# Patient Record
Sex: Female | Born: 1950 | Race: White | Hispanic: No | Smoking: Never smoker
Health system: Southern US, Community
[De-identification: ages and names within clinical notes are randomized; demographics above are authoritative.]

## PROBLEM LIST (undated history)

## (undated) DIAGNOSIS — M150 Primary generalized (osteo)arthritis: Secondary | ICD-10-CM

## (undated) DIAGNOSIS — K649 Unspecified hemorrhoids: Secondary | ICD-10-CM

## (undated) DIAGNOSIS — I10 Essential (primary) hypertension: Secondary | ICD-10-CM

## (undated) DIAGNOSIS — Z9889 Other specified postprocedural states: Secondary | ICD-10-CM

## (undated) DIAGNOSIS — K5790 Diverticulosis of intestine, part unspecified, without perforation or abscess without bleeding: Secondary | ICD-10-CM

## (undated) DIAGNOSIS — F32A Depression, unspecified: Secondary | ICD-10-CM

## (undated) DIAGNOSIS — T8859XA Other complications of anesthesia, initial encounter: Secondary | ICD-10-CM

## (undated) DIAGNOSIS — E039 Hypothyroidism, unspecified: Secondary | ICD-10-CM

## (undated) DIAGNOSIS — M797 Fibromyalgia: Secondary | ICD-10-CM

## (undated) DIAGNOSIS — M5416 Radiculopathy, lumbar region: Secondary | ICD-10-CM

## (undated) DIAGNOSIS — E079 Disorder of thyroid, unspecified: Secondary | ICD-10-CM

## (undated) DIAGNOSIS — K219 Gastro-esophageal reflux disease without esophagitis: Secondary | ICD-10-CM

## (undated) DIAGNOSIS — R0600 Dyspnea, unspecified: Secondary | ICD-10-CM

## (undated) DIAGNOSIS — M778 Other enthesopathies, not elsewhere classified: Secondary | ICD-10-CM

## (undated) DIAGNOSIS — K760 Fatty (change of) liver, not elsewhere classified: Secondary | ICD-10-CM

## (undated) DIAGNOSIS — M5412 Radiculopathy, cervical region: Secondary | ICD-10-CM

## (undated) DIAGNOSIS — E785 Hyperlipidemia, unspecified: Secondary | ICD-10-CM

## (undated) DIAGNOSIS — D122 Benign neoplasm of ascending colon: Secondary | ICD-10-CM

## (undated) DIAGNOSIS — G4733 Obstructive sleep apnea (adult) (pediatric): Secondary | ICD-10-CM

## (undated) DIAGNOSIS — G43909 Migraine, unspecified, not intractable, without status migrainosus: Secondary | ICD-10-CM

## (undated) DIAGNOSIS — R911 Solitary pulmonary nodule: Secondary | ICD-10-CM

## (undated) DIAGNOSIS — R768 Other specified abnormal immunological findings in serum: Secondary | ICD-10-CM

## (undated) DIAGNOSIS — R079 Chest pain, unspecified: Secondary | ICD-10-CM

## (undated) DIAGNOSIS — M8580 Other specified disorders of bone density and structure, unspecified site: Secondary | ICD-10-CM

## (undated) DIAGNOSIS — J449 Chronic obstructive pulmonary disease, unspecified: Secondary | ICD-10-CM

## (undated) HISTORY — PX: EYE SURGERY: SHX253

## (undated) HISTORY — PX: BUNIONECTOMY: SHX129

## (undated) HISTORY — PX: BACK SURGERY: SHX140

## (undated) HISTORY — PX: ABDOMINAL HYSTERECTOMY: SHX81

---

## 2010-06-23 ENCOUNTER — Ambulatory Visit: Payer: Self-pay | Admitting: Internal Medicine

## 2011-03-05 ENCOUNTER — Ambulatory Visit: Payer: Self-pay | Admitting: Family Medicine

## 2011-07-26 ENCOUNTER — Emergency Department: Payer: Self-pay | Admitting: Internal Medicine

## 2012-04-11 ENCOUNTER — Ambulatory Visit: Payer: Self-pay | Admitting: Family Medicine

## 2012-06-06 ENCOUNTER — Ambulatory Visit: Payer: Self-pay | Admitting: Family Medicine

## 2012-11-04 ENCOUNTER — Ambulatory Visit: Payer: Self-pay | Admitting: Family Medicine

## 2013-04-27 ENCOUNTER — Ambulatory Visit: Payer: Self-pay | Admitting: Family Medicine

## 2013-06-04 ENCOUNTER — Ambulatory Visit: Payer: Self-pay | Admitting: Family Medicine

## 2013-08-27 ENCOUNTER — Ambulatory Visit: Payer: Self-pay | Admitting: Specialist

## 2013-12-23 ENCOUNTER — Emergency Department: Payer: Self-pay | Admitting: Emergency Medicine

## 2013-12-23 LAB — CBC WITH DIFFERENTIAL/PLATELET
Basophil #: 0 10*3/uL (ref 0.0–0.1)
Basophil %: 0.3 %
Eosinophil #: 0.1 10*3/uL (ref 0.0–0.7)
Lymphocyte #: 0.8 10*3/uL — ABNORMAL LOW (ref 1.0–3.6)
Lymphocyte %: 6.6 %
MCH: 28.3 pg (ref 26.0–34.0)
MCHC: 33.2 g/dL (ref 32.0–36.0)
MCV: 85 fL (ref 80–100)
Monocyte #: 0.8 x10 3/mm (ref 0.2–0.9)
Monocyte %: 7 %
Neutrophil %: 85.4 %
Platelet: 266 10*3/uL (ref 150–440)
RBC: 5.28 10*6/uL — ABNORMAL HIGH (ref 3.80–5.20)
RDW: 13.7 % (ref 11.5–14.5)

## 2013-12-23 LAB — COMPREHENSIVE METABOLIC PANEL
Albumin: 4.6 g/dL (ref 3.4–5.0)
Alkaline Phosphatase: 110 U/L
Anion Gap: 6 — ABNORMAL LOW (ref 7–16)
BUN: 18 mg/dL (ref 7–18)
Bilirubin,Total: 0.4 mg/dL (ref 0.2–1.0)
Creatinine: 1.02 mg/dL (ref 0.60–1.30)
EGFR (African American): >60
EGFR (Non-African Amer.): 58 — ABNORMAL LOW
Osmolality: 274 (ref 275–301)
Potassium: 3.5 mmol/L (ref 3.5–5.1)
SGOT(AST): 38 U/L — ABNORMAL HIGH (ref 15–37)
SGPT (ALT): 75 U/L (ref 12–78)
Sodium: 136 mmol/L (ref 136–145)

## 2013-12-23 LAB — URINALYSIS, COMPLETE
Bacteria: NONE SEEN
Bilirubin,UR: NEGATIVE
Glucose,UR: NEGATIVE mg/dL (ref 0–75)
Nitrite: NEGATIVE
RBC,UR: 2 /HPF (ref 0–5)
Specific Gravity: 1.012 (ref 1.003–1.030)
Squamous Epithelial: 1
WBC UR: 1 /HPF (ref 0–5)

## 2013-12-23 LAB — RAPID INFLUENZA A&B ANTIGENS

## 2014-01-05 ENCOUNTER — Ambulatory Visit: Payer: Self-pay | Admitting: Family Medicine

## 2014-04-26 ENCOUNTER — Ambulatory Visit: Payer: Self-pay | Admitting: Family Medicine

## 2014-05-06 ENCOUNTER — Ambulatory Visit: Payer: Self-pay | Admitting: Family Medicine

## 2014-06-01 ENCOUNTER — Ambulatory Visit: Payer: Self-pay | Admitting: Anesthesiology

## 2014-06-01 LAB — POTASSIUM: Potassium: 3.4 mmol/L — ABNORMAL LOW (ref 3.5–5.1)

## 2014-06-11 ENCOUNTER — Ambulatory Visit: Payer: Self-pay | Admitting: Specialist

## 2015-04-08 ENCOUNTER — Other Ambulatory Visit: Payer: Self-pay | Admitting: Family Medicine

## 2015-04-08 DIAGNOSIS — Z1231 Encounter for screening mammogram for malignant neoplasm of breast: Secondary | ICD-10-CM

## 2015-04-16 NOTE — Op Note (Signed)
PATIENT NAME:  Emily Berry, HARTS MR#:  [id number] DATE OF BIRTH:  [birth year]  DATE OF PROCEDURE:  06/11/2014  PREOPERATIVE DIAGNOSIS:  Greater trochanteric bursitis, right hip.   POSTOPERATIVE DIAGNOSIS:  Greater trochanteric bursitis, right hip.   PROCEDURE PERFORMED:  Arthroscopic release of lateral fascia, right hip.   SURGEON:  Christophe Louis, M.D.   ANESTHESIA:  General.   COMPLICATIONS:  None.   DESCRIPTION OF PROCEDURE:  After adequate induction of general anesthesia, the right hip and leg were thoroughly prepped with alcohol and ChloraPrep and draped in standard sterile fashion. The 2 arthroscopic portals are infiltrated with 1% Xylocaine with epinephrine. One is approximately 3 inches proximal to the tip of the greater trochanter and the other is approximately 4 inches distal to the greater trochanter. Portals are then made with the knife. The trocar is used to clear the fat tissue off of the lateral fascia. Arthroscope is introduced proximally and the fascia lata visualized. The ArthroWand is introduced distally and visualized. The lateral fascia is then released from proximal to the tip of the greater trochanter to approximately 3 cm distally. Hypertrophic bursa material is resected. Range of motion demonstrates good clearance of the greater trochanter from the fascia lata. The wound is thoroughly irrigated multiple times. Portals are closed with 4-0 nylon. A soft bulky dressing is applied. The patient is returned to the recovery room in satisfactory condition having tolerated the procedure quite well.     ____________________________ Lucas Mallow, MD ces:dmm D: 06/17/2014 08:28:45 ET T: 06/17/2014 12:26:47 ET JOB#: [id number]  cc: Lucas Mallow, MD, <Dictator> Lucas Mallow MD ELECTRONICALLY SIGNED 06/18/2014 11:44

## 2015-05-09 ENCOUNTER — Other Ambulatory Visit: Payer: Self-pay | Admitting: Family Medicine

## 2015-05-09 ENCOUNTER — Ambulatory Visit
Admission: RE | Admit: 2015-05-09 | Discharge: 2015-05-09 | Disposition: A | Discharge status: Home / Self Care | Payer: Medicare HMO | Source: Ambulatory Visit | Attending: Family Medicine | Admitting: Family Medicine

## 2015-05-09 DIAGNOSIS — Z1231 Encounter for screening mammogram for malignant neoplasm of breast: Secondary | ICD-10-CM | POA: Diagnosis not present

## 2015-07-25 DIAGNOSIS — D239 Other benign neoplasm of skin, unspecified: Secondary | ICD-10-CM

## 2015-07-25 HISTORY — DX: Other benign neoplasm of skin, unspecified: D23.9

## 2015-11-11 ENCOUNTER — Other Ambulatory Visit: Payer: Self-pay | Admitting: Family Medicine

## 2015-11-11 DIAGNOSIS — M8580 Other specified disorders of bone density and structure, unspecified site: Secondary | ICD-10-CM

## 2015-11-14 ENCOUNTER — Other Ambulatory Visit: Payer: Self-pay | Admitting: Family Medicine

## 2015-11-14 DIAGNOSIS — R748 Abnormal levels of other serum enzymes: Secondary | ICD-10-CM

## 2015-11-18 ENCOUNTER — Ambulatory Visit
Admission: RE | Admit: 2015-11-18 | Discharge: 2015-11-18 | Disposition: A | Discharge status: Home / Self Care | Payer: Medicare HMO | Source: Ambulatory Visit | Attending: Family Medicine | Admitting: Family Medicine

## 2015-11-18 DIAGNOSIS — R748 Abnormal levels of other serum enzymes: Secondary | ICD-10-CM | POA: Diagnosis present

## 2015-11-24 ENCOUNTER — Ambulatory Visit: Payer: Medicare HMO | Attending: Family Medicine

## 2015-12-14 ENCOUNTER — Ambulatory Visit
Admission: RE | Admit: 2015-12-14 | Discharge: 2015-12-14 | Disposition: A | Discharge status: Home / Self Care | Payer: Medicare HMO | Source: Ambulatory Visit | Attending: Family Medicine | Admitting: Family Medicine

## 2015-12-14 DIAGNOSIS — Z780 Asymptomatic menopausal state: Secondary | ICD-10-CM | POA: Insufficient documentation

## 2015-12-14 DIAGNOSIS — M8580 Other specified disorders of bone density and structure, unspecified site: Secondary | ICD-10-CM | POA: Insufficient documentation

## 2016-01-04 ENCOUNTER — Other Ambulatory Visit: Payer: Self-pay | Admitting: Family Medicine

## 2016-01-04 ENCOUNTER — Ambulatory Visit
Admission: RE | Admit: 2016-01-04 | Discharge: 2016-01-04 | Disposition: A | Discharge status: Home / Self Care | Payer: Medicare HMO | Source: Ambulatory Visit | Attending: Family Medicine | Admitting: Family Medicine

## 2016-01-04 DIAGNOSIS — M79606 Pain in leg, unspecified: Secondary | ICD-10-CM | POA: Insufficient documentation

## 2016-01-04 DIAGNOSIS — M79609 Pain in unspecified limb: Secondary | ICD-10-CM

## 2016-06-27 ENCOUNTER — Other Ambulatory Visit: Payer: Self-pay | Admitting: Family Medicine

## 2016-06-27 DIAGNOSIS — Z1231 Encounter for screening mammogram for malignant neoplasm of breast: Secondary | ICD-10-CM

## 2016-07-03 ENCOUNTER — Ambulatory Visit: Payer: Medicare HMO

## 2016-07-12 ENCOUNTER — Other Ambulatory Visit: Payer: Self-pay | Admitting: Family Medicine

## 2016-07-12 ENCOUNTER — Ambulatory Visit
Admission: RE | Admit: 2016-07-12 | Discharge: 2016-07-12 | Disposition: A | Discharge status: Home / Self Care | Payer: Medicare HMO | Source: Ambulatory Visit | Attending: Family Medicine | Admitting: Family Medicine

## 2016-07-12 DIAGNOSIS — Z1231 Encounter for screening mammogram for malignant neoplasm of breast: Secondary | ICD-10-CM | POA: Diagnosis not present

## 2016-07-12 DIAGNOSIS — R928 Other abnormal and inconclusive findings on diagnostic imaging of breast: Secondary | ICD-10-CM | POA: Diagnosis not present

## 2016-07-16 ENCOUNTER — Other Ambulatory Visit: Payer: Self-pay | Admitting: Family Medicine

## 2016-07-16 DIAGNOSIS — N6310 Unspecified lump in the right breast, unspecified quadrant: Secondary | ICD-10-CM

## 2016-07-18 ENCOUNTER — Ambulatory Visit
Admission: RE | Admit: 2016-07-18 | Discharge: 2016-07-18 | Disposition: A | Discharge status: Home / Self Care | Payer: Medicare HMO | Source: Ambulatory Visit | Attending: Family Medicine | Admitting: Family Medicine

## 2016-07-18 DIAGNOSIS — N6001 Solitary cyst of right breast: Secondary | ICD-10-CM | POA: Diagnosis not present

## 2016-07-18 DIAGNOSIS — N6310 Unspecified lump in the right breast, unspecified quadrant: Secondary | ICD-10-CM

## 2016-07-18 DIAGNOSIS — N63 Unspecified lump in breast: Secondary | ICD-10-CM | POA: Diagnosis present

## 2016-12-06 ENCOUNTER — Ambulatory Visit
Admission: RE | Admit: 2016-12-06 | Discharge: 2016-12-06 | Disposition: A | Discharge status: Home / Self Care | Payer: Medicare HMO | Source: Ambulatory Visit | Attending: Family Medicine | Admitting: Family Medicine

## 2016-12-06 ENCOUNTER — Other Ambulatory Visit: Payer: Self-pay | Admitting: Family Medicine

## 2016-12-06 DIAGNOSIS — M79622 Pain in left upper arm: Secondary | ICD-10-CM

## 2016-12-26 ENCOUNTER — Ambulatory Visit
Admission: RE | Admit: 2016-12-26 | Discharge: 2016-12-26 | Disposition: A | Discharge status: Home / Self Care | Payer: Medicare HMO | Source: Ambulatory Visit | Attending: Family Medicine | Admitting: Family Medicine

## 2016-12-26 ENCOUNTER — Other Ambulatory Visit: Payer: Self-pay | Admitting: Family Medicine

## 2016-12-26 DIAGNOSIS — M79622 Pain in left upper arm: Secondary | ICD-10-CM | POA: Diagnosis present

## 2017-02-13 DIAGNOSIS — M79602 Pain in left arm: Secondary | ICD-10-CM | POA: Insufficient documentation

## 2017-02-21 ENCOUNTER — Other Ambulatory Visit: Payer: Self-pay | Admitting: Student

## 2017-02-21 DIAGNOSIS — M7582 Other shoulder lesions, left shoulder: Principal | ICD-10-CM

## 2017-02-21 DIAGNOSIS — M778 Other enthesopathies, not elsewhere classified: Secondary | ICD-10-CM

## 2017-03-06 ENCOUNTER — Ambulatory Visit: Payer: Medicare HMO

## 2017-03-12 ENCOUNTER — Ambulatory Visit
Admission: RE | Admit: 2017-03-12 | Discharge: 2017-03-12 | Disposition: A | Discharge status: Home / Self Care | Payer: Medicare HMO | Source: Ambulatory Visit | Attending: Student | Admitting: Student

## 2017-03-12 DIAGNOSIS — M19012 Primary osteoarthritis, left shoulder: Secondary | ICD-10-CM | POA: Diagnosis not present

## 2017-03-12 DIAGNOSIS — M7582 Other shoulder lesions, left shoulder: Secondary | ICD-10-CM | POA: Insufficient documentation

## 2017-03-12 DIAGNOSIS — M778 Other enthesopathies, not elsewhere classified: Secondary | ICD-10-CM

## 2017-04-16 DIAGNOSIS — M7582 Other shoulder lesions, left shoulder: Secondary | ICD-10-CM

## 2017-04-16 DIAGNOSIS — M778 Other enthesopathies, not elsewhere classified: Secondary | ICD-10-CM | POA: Insufficient documentation

## 2017-05-23 ENCOUNTER — Ambulatory Visit
Admission: RE | Admit: 2017-05-23 | Discharge: 2017-05-23 | Disposition: A | Discharge status: Home / Self Care | Payer: Medicare HMO | Source: Ambulatory Visit | Attending: Family Medicine | Admitting: Family Medicine

## 2017-05-23 ENCOUNTER — Other Ambulatory Visit: Payer: Self-pay | Admitting: Family Medicine

## 2017-05-23 DIAGNOSIS — M25512 Pain in left shoulder: Secondary | ICD-10-CM | POA: Diagnosis not present

## 2017-05-23 DIAGNOSIS — M79622 Pain in left upper arm: Secondary | ICD-10-CM

## 2017-05-29 ENCOUNTER — Emergency Department
Admission: EM | Admit: 2017-05-29 | Discharge: 2017-05-29 | Disposition: A | Discharge status: Home / Self Care | Payer: Medicare HMO | Attending: Emergency Medicine | Admitting: Emergency Medicine

## 2017-05-29 DIAGNOSIS — M19012 Primary osteoarthritis, left shoulder: Secondary | ICD-10-CM | POA: Insufficient documentation

## 2017-05-29 DIAGNOSIS — M779 Enthesopathy, unspecified: Secondary | ICD-10-CM

## 2017-05-29 DIAGNOSIS — M25512 Pain in left shoulder: Secondary | ICD-10-CM | POA: Diagnosis present

## 2017-05-29 DIAGNOSIS — E079 Disorder of thyroid, unspecified: Secondary | ICD-10-CM | POA: Diagnosis not present

## 2017-05-29 HISTORY — DX: Disorder of thyroid, unspecified: E07.9

## 2017-05-29 MED ORDER — LIDOCAINE 5 % EX PTCH: 1.0000 | MEDICATED_PATCH | CUTANEOUS | 0 refills | Status: DC

## 2017-05-29 MED ORDER — LIDOCAINE 5 % EX PTCH
1.0000 | MEDICATED_PATCH | CUTANEOUS | Status: DC
  Administered 2017-05-29: 1 via TRANSDERMAL
  Filled 2017-05-29: qty 1

## 2017-05-29 MED ORDER — IBUPROFEN 800 MG PO TABS: 800.0000 mg | ORAL_TABLET | Freq: Three times a day (TID) | ORAL | 1 refills | Status: DC | PRN

## 2017-05-29 MED ORDER — LIDOCAINE 5 % EX PTCH: 1.0000 | MEDICATED_PATCH | CUTANEOUS | 0 refills | Status: AC

## 2017-05-29 MED ORDER — KETOROLAC TROMETHAMINE 60 MG/2ML IM SOLN
30.0000 mg | Freq: Once | INTRAMUSCULAR | Status: AC
  Administered 2017-05-29: 30 mg via INTRAMUSCULAR
  Filled 2017-05-29: qty 2

## 2017-05-29 MED ORDER — IBUPROFEN 800 MG PO TABS: 800.0000 mg | ORAL_TABLET | Freq: Three times a day (TID) | ORAL | 0 refills | Status: DC | PRN

## 2017-05-29 NOTE — ED Notes (Signed)
Pt discharged home after verbalizing understanding of discharge instructions; nad noted. 

## 2017-05-29 NOTE — ED Triage Notes (Signed)
PT present to ER c/o left arm pain that has been ongoing since November. Pt has seen ortho and multiple doctors for the "burning pain" to left arm. Pt had injection for pain 05/16/17, did not help. Pt denies CP, SOB. Worse with movement. Pt alert and oriented X4, active, cooperative, pt in NAD. RR even and unlabored, color WNL.

## 2017-05-29 NOTE — ED Notes (Signed)
Chronic left arm pain since November. Had MRI and xray. Pain today 10/10 +2 pulses.

## 2017-05-29 NOTE — ED Provider Notes (Signed)
[facility] Emergency Department Provider Note  ____________________________________________  Time seen: Approximately 5:28 PM  I have reviewed the triage vital signs and the nursing notes.   HISTORY  Chief Complaint Arm Pain    HPI Emily Berry is a 66 y.o. female that presents to the emergency department with left shoulder pain for 7 months. Patient states that she has seen multiple doctors and had multiple tests done and doesn't understand what is happening. She states that she gets shots and pain medicine but no one explains to her what they are doing. She has seen orthopedic doctors and rheumatologists. She has also tried physical therapy, which seemed to make things worse. Nothing seems to be helping. She describes the pain in her shoulder as a burning sensation. She has limited range of motion due to pain. Pain was worse this morning so she came to the emergency department. Percocet and ibuprofen did not touch the pain. She does not know how much ibuprofen to take. She does not have a driver. She has a family history of heart disease so she has an appointment with cardiologist on Monday. She denies fever, shortness of breath, chest pain, nausea, vomiting, abdominal pain, numbness, tingling.   Past Medical History:  Diagnosis Date  . Thyroid disease     There are no active problems to display for this patient.   History reviewed. No pertinent surgical history.  Prior to Admission medications   Medication Sig Start Date End Date Taking? Authorizing Provider  ibuprofen (ADVIL,MOTRIN) 800 MG tablet Take 1 tablet (800 mg total) by mouth every 8 (eight) hours as needed. 05/29/17   Laban Emperor, PA-C  lidocaine (LIDODERM) 5 % Place 1 patch onto the skin daily. Remove & Discard patch within 12 hours or as directed by MD 05/29/17 05/29/18  Laban Emperor, PA-C    Allergies Codeine  Family History  Problem Relation Age of Onset  . Cancer Mother 39        cervical  . Cancer Brother        sinus, bone ca  . Breast cancer Neg Hx     Social History Social History  Substance Use Topics  . Smoking status: Never Smoker  . Smokeless tobacco: Never Used  . Alcohol use No     Review of Systems  Cardiovascular: No chest pain. Respiratory: No SOB. Gastrointestinal: No abdominal pain.  No nausea, no vomiting.  Musculoskeletal: Positive for shoulder pain. Skin: Negative for rash, abrasions, lacerations, ecchymosis. Neurological: Negative for headaches, numbness or tingling   ____________________________________________   PHYSICAL EXAM:  VITAL SIGNS: ED Triage Vitals [05/29/17 1550]  Enc Vitals Group     BP (!) 150/83     Pulse Rate (!) 102     Resp 18     Temp 98.4 F (36.9 C)     Temp Source Oral     SpO2 97 %     Weight 156 lb (70.8 kg)     Height 4\' 11"  (1.499 m)     Head Circumference      Peak Flow      Pain Score 10     Pain Loc      Pain Edu?      Excl. in [place]?      Constitutional: Alert and oriented. Well appearing and in no acute distress. Eyes: Conjunctivae are normal. PERRL. EOMI. Head: Atraumatic. ENT:      Ears:      Nose: No congestion/rhinnorhea.  Mouth/Throat: Mucous membranes are moist.  Neck: No stridor. No cervical spine tenderness to palpation. Cardiovascular: Normal rate, regular rhythm.  Good peripheral circulation. 2+ radial pulses. Respiratory: Normal respiratory effort without tachypnea or retractions. Lungs CTAB. Good air entry to the bases with no decreased or absent breath sounds. Musculoskeletal: Full range of motion to all extremities. No gross deformities appreciated. Limited range of motion and strength of left shoulder secondary to pain. Tenderness to patient throughout upper arm. No swelling. Neurologic:  Normal speech and language. No gross focal neurologic deficits are appreciated.  Skin:  Skin is warm, dry and intact. No rash  noted.   ____________________________________________   LABS (all labs ordered are listed, but only abnormal results are displayed)  Labs Reviewed - No data to display ____________________________________________  EKG   ____________________________________________  RADIOLOGY   No results found.  ____________________________________________    PROCEDURES  Procedure(s) performed:    Procedures    Medications  lidocaine (LIDODERM) 5 % 1 patch (1 patch Transdermal Patch Applied 05/29/17 1730)  ketorolac (TORADOL) injection 30 mg (30 mg Intramuscular Given 05/29/17 1728)     ____________________________________________   INITIAL IMPRESSION / ASSESSMENT AND PLAN / ED COURSE  Pertinent labs & imaging results that were available during my care of the patient were reviewed by me and considered in my medical decision making (see chart for details).  Review of the [place] CSRS was performed in accordance of the [place] prior to dispensing any controlled drugs.     Patient's diagnosis is consistent with osteoarthritis and tendinosis. Vital signs and exam are reassuring. Patient has had multiple x-rays and an MRI completed recently. She has follow-up with orthopedics and rheumatology. Symptoms are not new and patient just wants somebody to explain her test results to her. Patient will be discharged home with prescriptions for ibuprofen and lidocaine patch. Patient is given ED precautions to return to the ED for any worsening or new symptoms.     ____________________________________________  FINAL CLINICAL IMPRESSION(S) / ED DIAGNOSES  Final diagnoses:  Osteoarthritis of left shoulder, unspecified osteoarthritis type  Tendinitis      NEW MEDICATIONS STARTED DURING THIS VISIT:  Discharge Medication List as of 05/29/2017  6:24 PM          This chart was dictated using voice recognition software/Dragon. Despite best efforts to proofread, errors can occur which can  change the meaning. Any change was purely unintentional.    Laban Emperor, PA-C 05/29/17 1836    [facility] Amor, MD 05/30/17 1110

## 2017-06-03 DIAGNOSIS — E782 Mixed hyperlipidemia: Secondary | ICD-10-CM | POA: Insufficient documentation

## 2017-06-03 DIAGNOSIS — I208 Other forms of angina pectoris: Secondary | ICD-10-CM | POA: Insufficient documentation

## 2017-06-03 DIAGNOSIS — I10 Essential (primary) hypertension: Secondary | ICD-10-CM | POA: Insufficient documentation

## 2017-06-27 DIAGNOSIS — M5412 Radiculopathy, cervical region: Secondary | ICD-10-CM | POA: Insufficient documentation

## 2017-07-16 ENCOUNTER — Other Ambulatory Visit: Payer: Self-pay | Admitting: Family Medicine

## 2017-07-16 DIAGNOSIS — Z1231 Encounter for screening mammogram for malignant neoplasm of breast: Secondary | ICD-10-CM

## 2017-07-23 ENCOUNTER — Other Ambulatory Visit: Payer: Self-pay | Admitting: Family Medicine

## 2017-07-23 DIAGNOSIS — M79622 Pain in left upper arm: Secondary | ICD-10-CM

## 2017-07-28 IMAGING — MG MM DIGITAL SCREENING BILAT W/ TOMO W/ CAD
9 of 13 series · 9 of 29 positions shown · non-contrast
Comparison: Previous exam(s).

CLINICAL DATA: Screening.

EXAM:
2D DIGITAL SCREENING BILATERAL MAMMOGRAM WITH CAD AND ADJUNCT TOMO

[R MLO (1 of 2)]
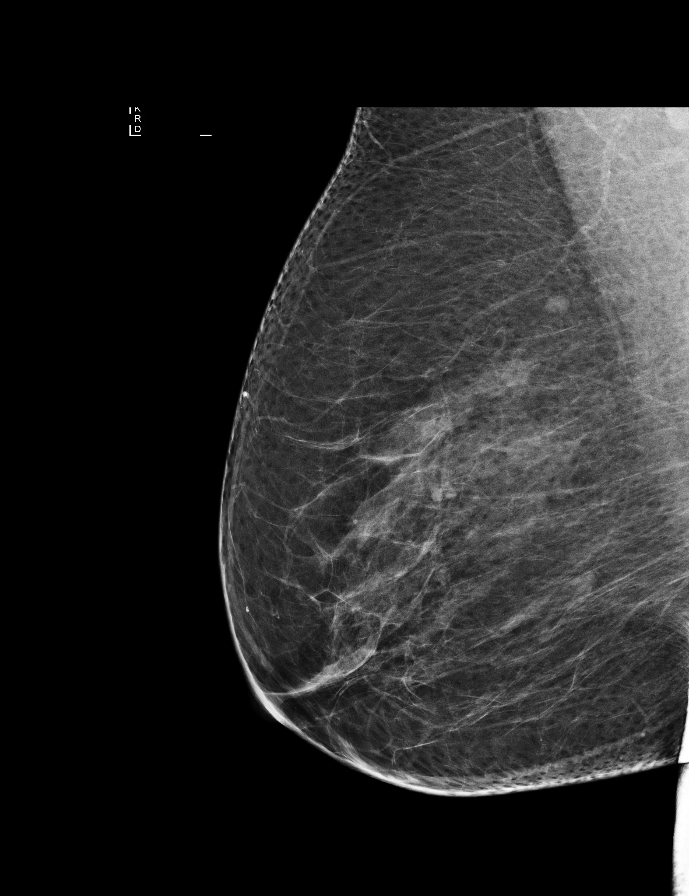

[R CC]
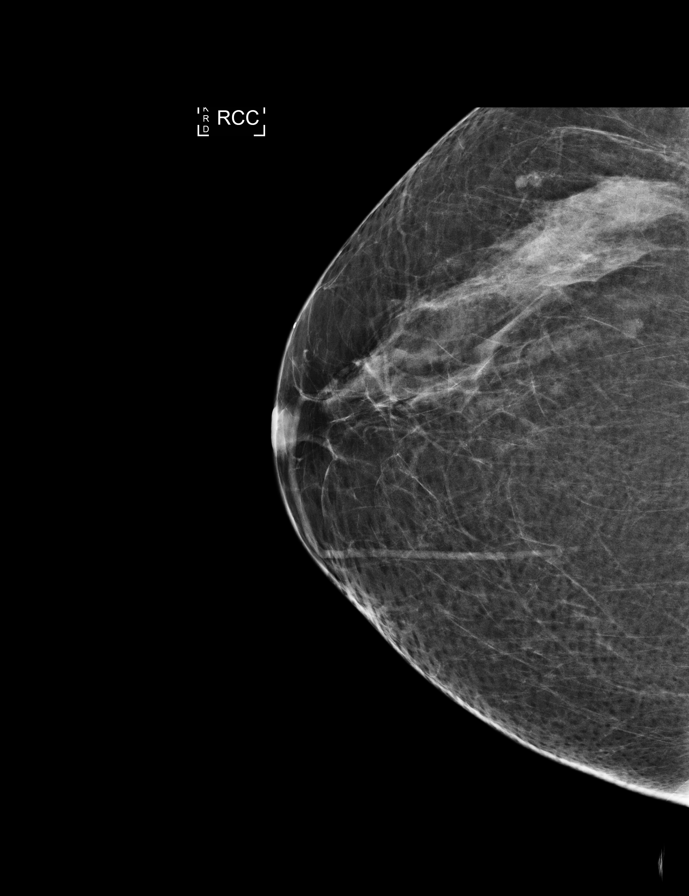

[L MLO synth-2D]
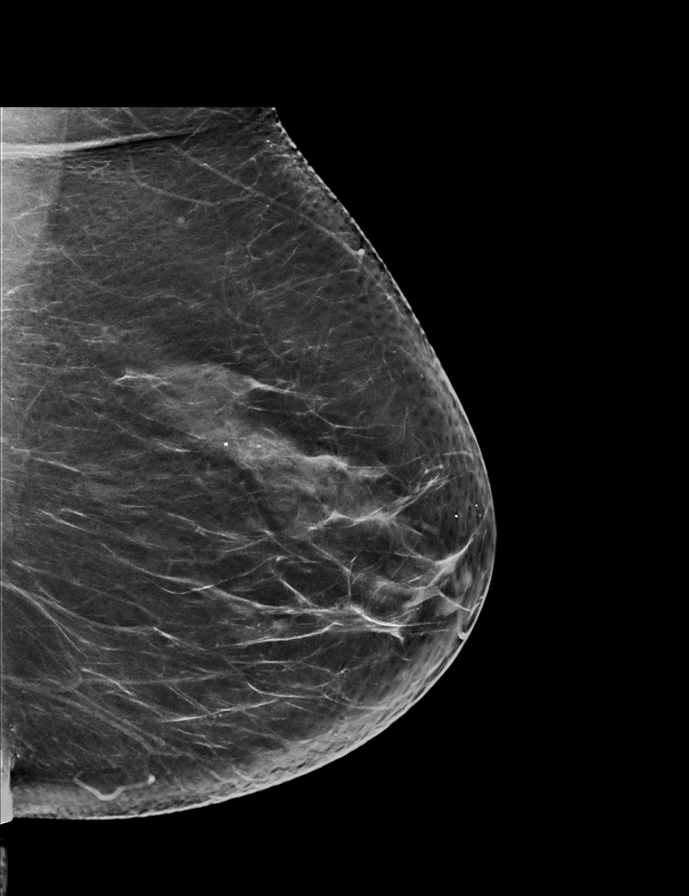

[R CC synth-2D]
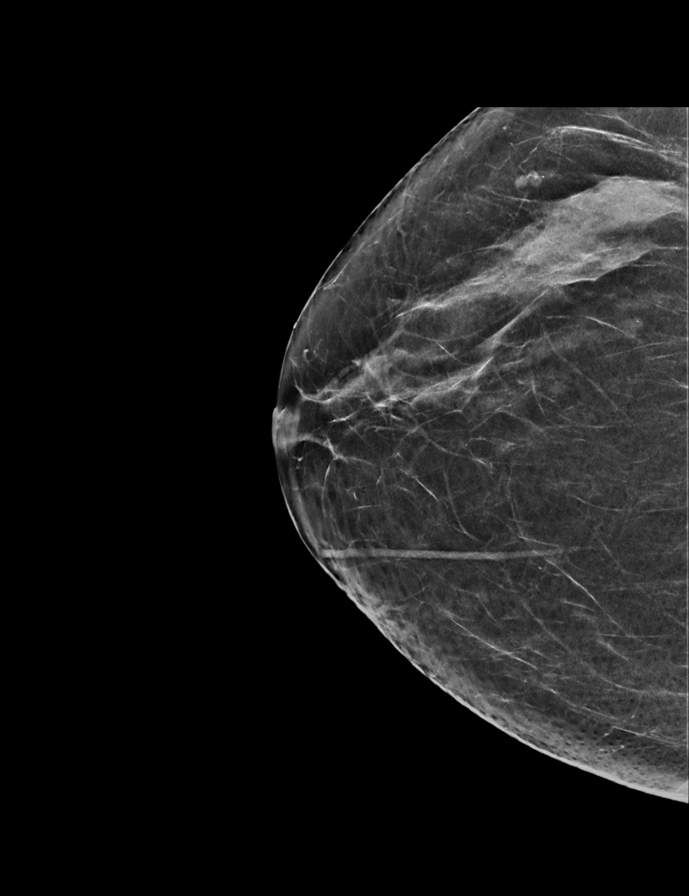

[L CC synth-2D]
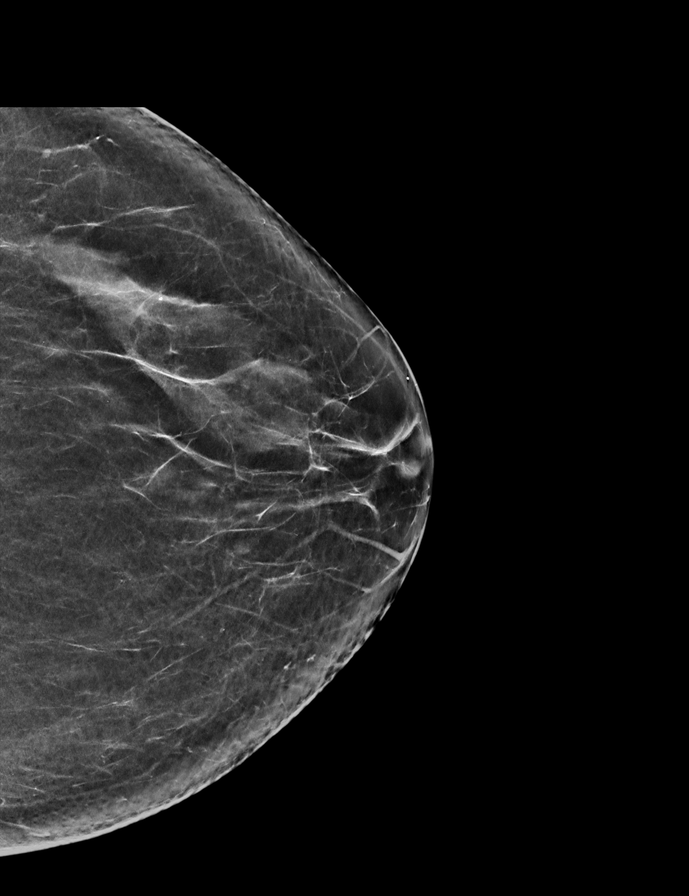

[L CC]
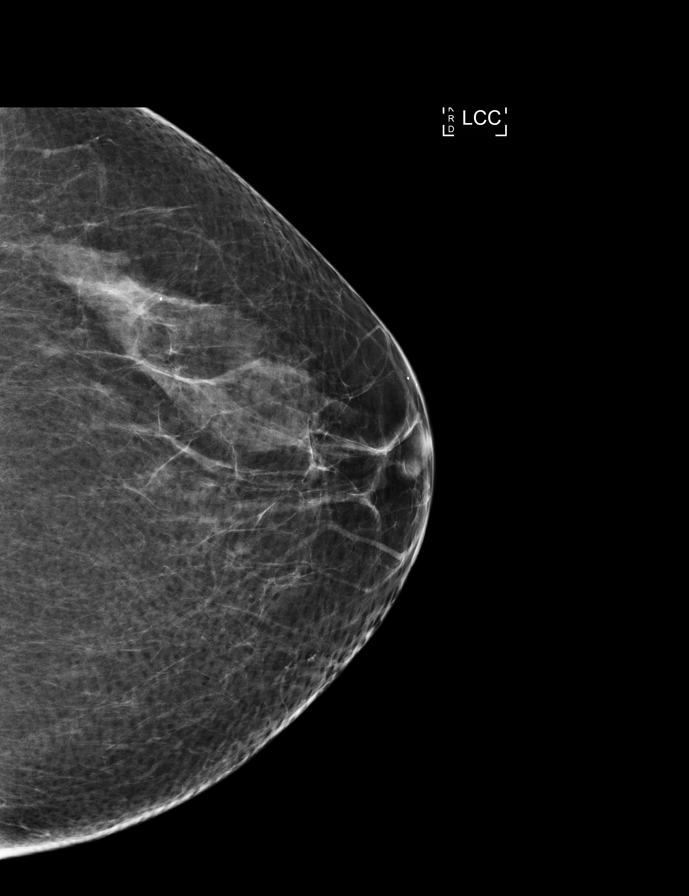

[R MLO synth-2D]
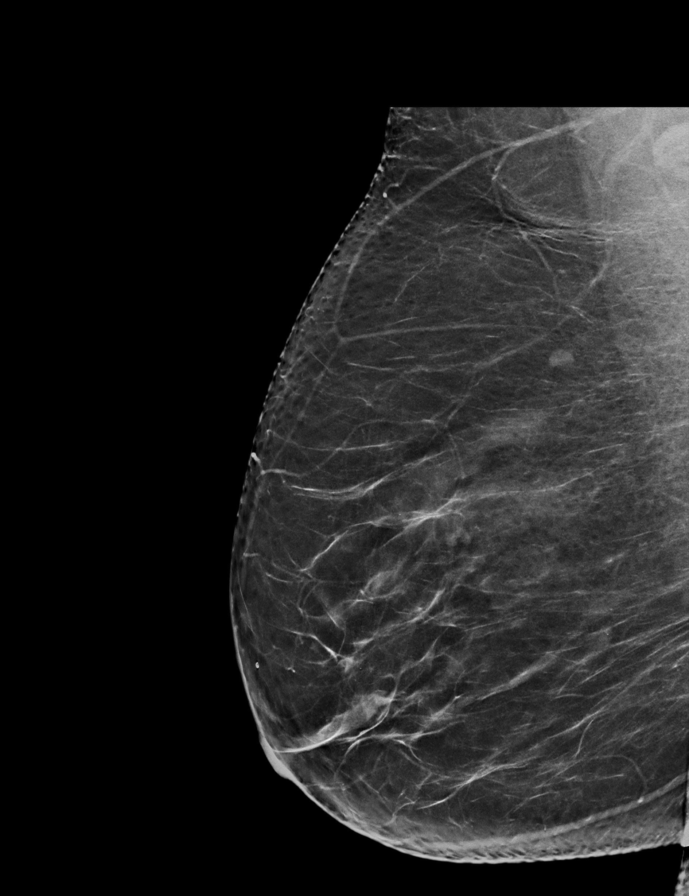

[R MLO (2 of 2)]
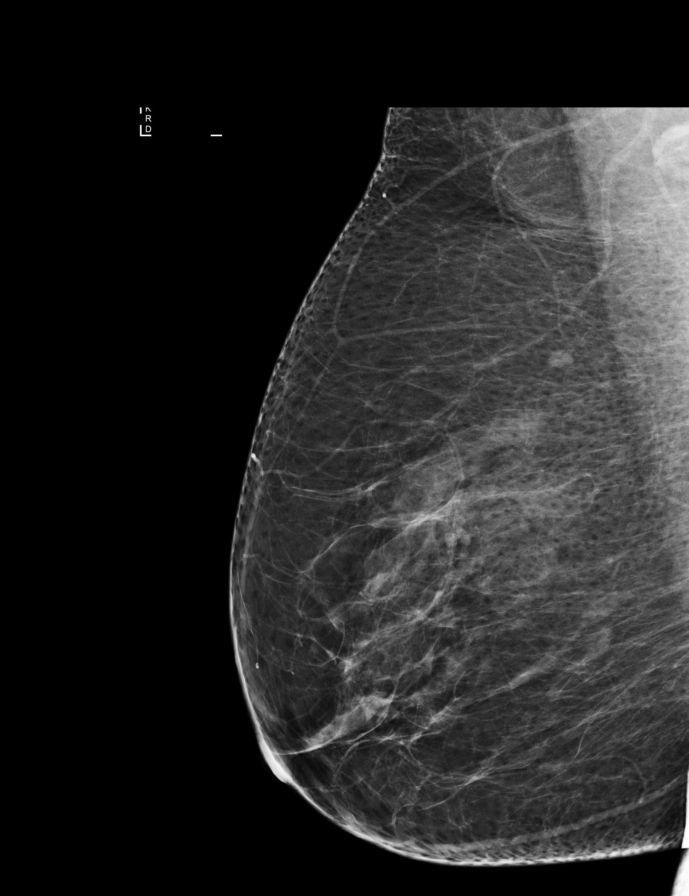

[L MLO]
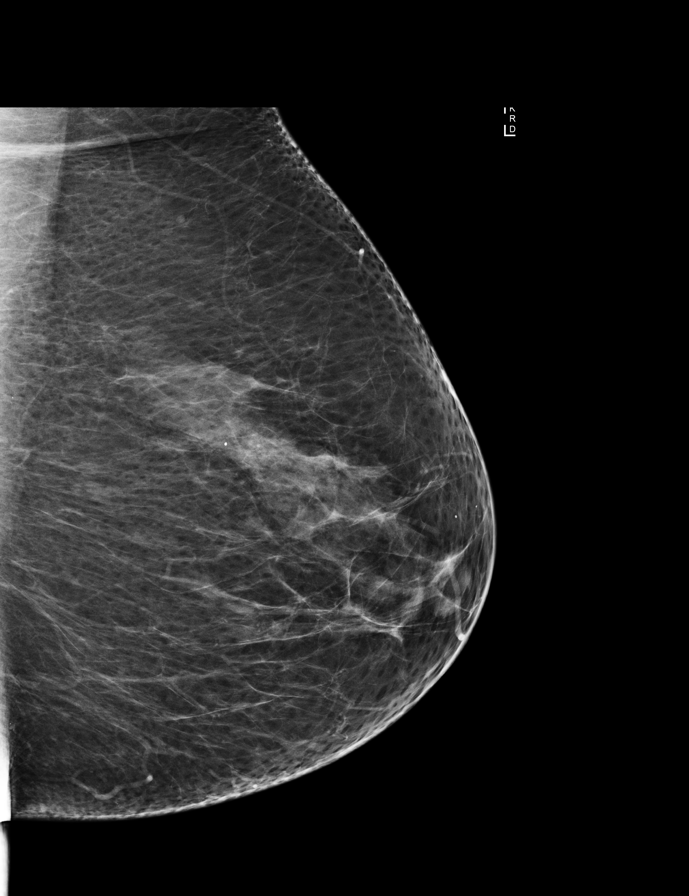

[9 of 29 positions shown; findings below may reference images not displayed]

ACR Breast Density Category b: There are scattered areas of
fibroglandular density.
FINDINGS: In the right breast, a possible mass warrants further evaluation. In
the left breast, no findings suspicious for malignancy. Images were
processed with CAD.
IMPRESSION: Further evaluation is suggested for possible mass in the right
breast.

RECOMMENDATION:
Diagnostic mammogram and possibly ultrasound of the right breast.
(Code:[id number])

The patient will be contacted regarding the findings, and additional
imaging will be scheduled.

BI-RADS CATEGORY  0: Incomplete. Need additional imaging evaluation
and/or prior mammograms for comparison.

## 2017-07-29 ENCOUNTER — Ambulatory Visit
Admission: RE | Admit: 2017-07-29 | Discharge: 2017-07-29 | Disposition: A | Discharge status: Home / Self Care | Payer: Medicare HMO | Source: Ambulatory Visit | Attending: Family Medicine | Admitting: Family Medicine

## 2017-07-29 DIAGNOSIS — Z1231 Encounter for screening mammogram for malignant neoplasm of breast: Secondary | ICD-10-CM | POA: Insufficient documentation

## 2017-07-30 ENCOUNTER — Ambulatory Visit: Admission: RE | Admit: 2017-07-30 | Payer: Medicare HMO | Source: Ambulatory Visit

## 2017-08-04 ENCOUNTER — Emergency Department
Admission: EM | Admit: 2017-08-04 | Discharge: 2017-08-04 | Disposition: A | Discharge status: Home / Self Care | Payer: Medicare HMO | Attending: Emergency Medicine | Admitting: Emergency Medicine

## 2017-08-04 DIAGNOSIS — E079 Disorder of thyroid, unspecified: Secondary | ICD-10-CM | POA: Insufficient documentation

## 2017-08-04 DIAGNOSIS — J069 Acute upper respiratory infection, unspecified: Secondary | ICD-10-CM | POA: Diagnosis not present

## 2017-08-04 DIAGNOSIS — R070 Pain in throat: Secondary | ICD-10-CM | POA: Diagnosis present

## 2017-08-04 MED ORDER — PREDNISONE 50 MG PO TABS: ORAL_TABLET | ORAL | 0 refills | Status: DC

## 2017-08-04 MED ORDER — BENZONATATE 100 MG PO CAPS: 100.0000 mg | ORAL_CAPSULE | Freq: Three times a day (TID) | ORAL | 0 refills | Status: AC | PRN

## 2017-08-04 NOTE — ED Notes (Signed)

## 2017-08-04 NOTE — ED Provider Notes (Signed)
[facility] Emergency Department Provider Note  ____________________________________________  Time seen: Approximately 5:39 PM  I have reviewed the triage vital signs and the nursing notes.   HISTORY  Chief Complaint Sore Throat    HPI Emily Berry is a 66 y.o. female presenting to the emergency department with rhinorrhea, pharyngitis, congestion,  nonproductive cough and bilateral otalgia for the past 3 days. Patient states that she saw her PCP was prescribed amoxicillin. Patient states that her otalgia has persisted, especially on the left. Patient denies shortness of breath, purulent sputum production or changes in energy. She denies fever or chills.   Past Medical History:  Diagnosis Date  . Thyroid disease     There are no active problems to display for this patient.   No past surgical history on file.  Prior to Admission medications   Medication Sig Start Date End Date Taking? Authorizing Provider  benzonatate (TESSALON PERLES) 100 MG capsule Take 1 capsule (100 mg total) by mouth 3 (three) times daily as needed for cough. 08/04/17 08/11/17  Lannie Fields, PA-C  ibuprofen (ADVIL,MOTRIN) 800 MG tablet Take 1 tablet (800 mg total) by mouth every 8 (eight) hours as needed. 05/29/17   Laban Emperor, PA-C  lidocaine (LIDODERM) 5 % Place 1 patch onto the skin daily. Remove & Discard patch within 12 hours or as directed by MD 05/29/17 05/29/18  Laban Emperor, PA-C  predniSONE (DELTASONE) 50 MG tablet Take one tablet by mouth for the next five days. 08/04/17   Lannie Fields, PA-C    Allergies Codeine  Family History  Problem Relation Age of Onset  . Cancer Mother 20       cervical  . Cancer Brother        sinus, bone ca  . Breast cancer Neg Hx     Social History Social History  Substance Use Topics  . Smoking status: Never Smoker  . Smokeless tobacco: Never Used  . Alcohol use No     Review of Systems  Constitutional: Patient has been  afebrile Eyes: No visual changes. No discharge ENT: Patient has had congestion.  Cardiovascular: no chest pain. Respiratory: Patient has had non-productive cough.  No SOB. Gastrointestinal: No nausea, vomiting or diarrhea. Genitourinary: Negative for dysuria. No hematuria Musculoskeletal: Patient has had myalgias. Skin: Negative for rash, abrasions, lacerations, ecchymosis. Neurological: Negative for headaches, focal weakness or numbness.    ____________________________________________   PHYSICAL EXAM:  VITAL SIGNS: ED Triage Vitals [08/04/17 1642]  Enc Vitals Group     BP (!) 175/85     Pulse Rate (!) 113     Resp 20     Temp 98.9 F (37.2 C)     Temp Source Oral     SpO2 99 %     Weight 155 lb (70.3 kg)     Height 5' (1.524 m)     Head Circumference      Peak Flow      Pain Score 6     Pain Loc      Pain Edu?      Excl. in [place]?      Constitutional: Alert and oriented. Patient is lying supine in bed.  Eyes: Conjunctivae are normal. PERRL. EOMI. Head: Atraumatic. ENT:      Ears: Left tympanic membrane is erythematous with effusion. Right tympanic membrane is nonerythematous and retracted. No pain elicited with palpation of the tragus bilaterally.      Nose: Nasal turbinates are edematous and erythematous.  Copious rhinorrhea visualized.      Mouth/Throat: Mucous membranes are moist. Posterior pharynx is mildly erythematous. No tonsillar hypertrophy or purulent exudate. Uvula is midline. Neck: Full range of motion. No pain is elicited with flexion at the neck. Hematological/Lymphatic/Immunilogical: No cervical lymphadenopathy. Cardiovascular: Normal rate, regular rhythm. Normal S1 and S2.  Good peripheral circulation. Respiratory: Normal respiratory effort without tachypnea or retractions. Lungs CTAB. Good air entry to the bases with no decreased or absent breath sounds. Gastrointestinal: Bowel sounds 4 quadrants. Soft and nontender to palpation. No guarding or  rigidity. No palpable masses. No distention. No CVA tenderness.  Skin:  Skin is warm, dry and intact. No rash noted. Psychiatric: Mood and affect are normal. Speech and behavior are normal. Patient exhibits appropriate insight and judgement.   ____________________________________________   LABS (all labs ordered are listed, but only abnormal results are displayed)  Labs Reviewed - No data to display ____________________________________________  EKG   ____________________________________________  RADIOLOGY  No results found.  ____________________________________________    PROCEDURES  Procedure(s) performed:    Procedures    Medications - No data to display   ____________________________________________   INITIAL IMPRESSION / ASSESSMENT AND PLAN / ED COURSE  Pertinent labs & imaging results that were available during my care of the patient were reviewed by me and considered in my medical decision making (see chart for details).  Review of the [place] CSRS was performed in accordance of the [place] prior to dispensing any controlled drugs.     Assessment and plan Viral upper respiratory tract infection Patient presents to the emergency department with rhinorrhea, congestion and nonproductive cough for the past 3 days. Patient also reports bilateral otalgia. I suspect that patient has eustachian tube dysfunction secondary to an upper respiratory tract infection. Patient was discharged with a short course of prednisone. Patient was also discharged with [facility] for cough. Patient was advised to return to the emergency department if she should experience purulent sputum production, shortness of breath, fatigue and fever. Patient voiced understanding regarding his recommendations. She was advised to follow-up with her PCP as needed. All patient questions were answered.   ____________________________________________  FINAL CLINICAL IMPRESSION(S) / ED  DIAGNOSES  Final diagnoses:  Viral upper respiratory illness      NEW MEDICATIONS STARTED DURING THIS VISIT:  New Prescriptions   BENZONATATE (TESSALON PERLES) 100 MG CAPSULE    Take 1 capsule (100 mg total) by mouth 3 (three) times daily as needed for cough.   PREDNISONE (DELTASONE) 50 MG TABLET    Take one tablet by mouth for the next five days.        This chart was dictated using voice recognition software/Dragon. Despite best efforts to proofread, errors can occur which can change the meaning. Any change was purely unintentional.    Lannie Fields, PA-C 08/04/17 1745    Carrie Mew, MD 08/05/17 2330

## 2017-08-04 NOTE — ED Triage Notes (Signed)
Pt states that her sore throat started Tuesday, pt states that the pain has cont to get worse and now her left has "closed up" pt states that it sounds like water is in her ear

## 2017-08-04 NOTE — ED Notes (Signed)
FIRST NURSE NOTE:  Pt reports unable to hear out of both ears and sore throat. Pt also coughing while in lobby.

## 2017-08-04 NOTE — ED Notes (Signed)
Pt c/o sore throat starting Tuesday and bil ear aches starting Thursday. Saw PCP at [facility] Friday and was started on amoxicillin. States pain and hearing loss has worsened since starting abt. Pt also c/o non-productive cough.

## 2017-08-07 ENCOUNTER — Ambulatory Visit: Admission: RE | Admit: 2017-08-07 | Payer: Medicare HMO | Source: Ambulatory Visit

## 2017-08-20 ENCOUNTER — Ambulatory Visit: Admission: RE | Admit: 2017-08-20 | Payer: Medicare HMO | Source: Ambulatory Visit

## 2017-09-03 ENCOUNTER — Ambulatory Visit
Admission: RE | Admit: 2017-09-03 | Discharge: 2017-09-03 | Disposition: A | Discharge status: Home / Self Care | Payer: Medicare HMO | Source: Ambulatory Visit | Attending: Family Medicine | Admitting: Family Medicine

## 2017-09-03 DIAGNOSIS — M7522 Bicipital tendinitis, left shoulder: Secondary | ICD-10-CM | POA: Diagnosis not present

## 2017-09-03 DIAGNOSIS — M79622 Pain in left upper arm: Secondary | ICD-10-CM | POA: Diagnosis present

## 2017-09-03 MED ORDER — GADOBENATE DIMEGLUMINE 529 MG/ML IV SOLN
15.0000 mL | Freq: Once | INTRAVENOUS | Status: AC | PRN
  Administered 2017-09-03: 4 mL via INTRAVENOUS

## 2018-03-07 ENCOUNTER — Other Ambulatory Visit: Payer: Self-pay | Admitting: Family Medicine

## 2018-03-07 DIAGNOSIS — M8580 Other specified disorders of bone density and structure, unspecified site: Secondary | ICD-10-CM

## 2018-04-02 ENCOUNTER — Ambulatory Visit
Admission: RE | Admit: 2018-04-02 | Discharge: 2018-04-02 | Disposition: A | Discharge status: Home / Self Care | Payer: Medicare HMO | Source: Ambulatory Visit | Attending: Family Medicine | Admitting: Family Medicine

## 2018-04-02 DIAGNOSIS — M8580 Other specified disorders of bone density and structure, unspecified site: Secondary | ICD-10-CM | POA: Diagnosis not present

## 2018-04-02 DIAGNOSIS — Z13820 Encounter for screening for osteoporosis: Secondary | ICD-10-CM | POA: Insufficient documentation

## 2018-04-29 ENCOUNTER — Telehealth: Payer: Self-pay | Admitting: Gastroenterology

## 2018-04-29 NOTE — Telephone Encounter (Signed)
Gastroenterology Pre-Procedure Review  Request Date:   Requesting Physician: Dr.    PATIENT REVIEW QUESTIONS: The patient responded to the following health history questions as indicated:    1. Are you having any GI issues? no 2. Do you have a personal history of Polyps? no 3. Do you have a family history of Colon Cancer or Polyps? yes (brother polyps) 4. Diabetes Mellitus? no 5. Joint replacements in the past 12 months?no 6. Major health problems in the past 3 months?no 7. Any artificial heart valves, MVP, or defibrillator?yes (High BP)    MEDICATIONS & ALLERGIES:    Patient reports the following regarding taking any anticoagulation/antiplatelet therapy:   Plavix, Coumadin, Eliquis, Xarelto, Lovenox, Pradaxa, Brilinta, or Effient? no Aspirin? no  Patient confirms/reports the following medications:  Current Outpatient Medications  Medication Sig Dispense Refill   ibuprofen (ADVIL,MOTRIN) 800 MG tablet Take 1 tablet (800 mg total) by mouth every 8 (eight) hours as needed. 30 tablet 1   lidocaine (LIDODERM) 5 % Place 1 patch onto the skin daily. Remove & Discard patch within 12 hours or as directed by MD 10 patch 0   predniSONE (DELTASONE) 50 MG tablet Take one tablet by mouth for the next five days. 5 tablet 0   No current facility-administered medications for this visit.     Patient confirms/reports the following allergies:  Allergies  Allergen Reactions   Codeine Nausea Only    No orders of the defined types were placed in this encounter.   AUTHORIZATION INFORMATION Primary Insurance: 1D#: Group #:  Secondary Insurance: 1D#: Group #:  SCHEDULE INFORMATION: Date:  Time: Location:

## 2018-05-05 ENCOUNTER — Other Ambulatory Visit: Payer: Self-pay

## 2018-05-05 ENCOUNTER — Other Ambulatory Visit: Payer: Self-pay | Admitting: Gastroenterology

## 2018-05-05 DIAGNOSIS — M7070 Other bursitis of hip, unspecified hip: Secondary | ICD-10-CM | POA: Insufficient documentation

## 2018-05-05 DIAGNOSIS — Z1211 Encounter for screening for malignant neoplasm of colon: Secondary | ICD-10-CM

## 2018-05-05 MED ORDER — PEG 3350-KCL-NABCB-NACL-NASULF 236 G PO SOLR: 4000.0000 mL | Freq: Once | ORAL | 0 refills | Status: AC

## 2018-05-05 MED ORDER — PEG 3350-KCL-NABCB-NACL-NASULF 227.1 G PO SOLR: 227.1000 g | Freq: Once | ORAL | 0 refills | Status: DC

## 2018-05-16 ENCOUNTER — Encounter
Admission: RE | Disposition: A | Discharge status: Home / Self Care | Payer: Self-pay | Source: Ambulatory Visit | Attending: Gastroenterology

## 2018-05-16 ENCOUNTER — Ambulatory Visit: Payer: Medicare HMO | Admitting: Anesthesiology

## 2018-05-16 ENCOUNTER — Encounter: Payer: Self-pay | Admitting: Emergency Medicine

## 2018-05-16 ENCOUNTER — Ambulatory Visit
Admission: RE | Admit: 2018-05-16 | Discharge: 2018-05-16 | Disposition: A | Discharge status: Home / Self Care | Payer: Medicare HMO | Source: Ambulatory Visit | Attending: Gastroenterology | Admitting: Gastroenterology

## 2018-05-16 DIAGNOSIS — D122 Benign neoplasm of ascending colon: Secondary | ICD-10-CM | POA: Diagnosis not present

## 2018-05-16 DIAGNOSIS — I10 Essential (primary) hypertension: Secondary | ICD-10-CM | POA: Insufficient documentation

## 2018-05-16 DIAGNOSIS — Z1211 Encounter for screening for malignant neoplasm of colon: Secondary | ICD-10-CM | POA: Diagnosis not present

## 2018-05-16 DIAGNOSIS — E039 Hypothyroidism, unspecified: Secondary | ICD-10-CM | POA: Insufficient documentation

## 2018-05-16 DIAGNOSIS — K5730 Diverticulosis of large intestine without perforation or abscess without bleeding: Secondary | ICD-10-CM

## 2018-05-16 DIAGNOSIS — K648 Other hemorrhoids: Secondary | ICD-10-CM

## 2018-05-16 DIAGNOSIS — Z79899 Other long term (current) drug therapy: Secondary | ICD-10-CM | POA: Insufficient documentation

## 2018-05-16 DIAGNOSIS — K219 Gastro-esophageal reflux disease without esophagitis: Secondary | ICD-10-CM | POA: Insufficient documentation

## 2018-05-16 HISTORY — PX: COLONOSCOPY WITH PROPOFOL: SHX5780

## 2018-05-16 HISTORY — DX: Hypothyroidism, unspecified: E03.9

## 2018-05-16 SURGERY — COLONOSCOPY WITH PROPOFOL
Anesthesia: General

## 2018-05-16 MED ORDER — PHENYLEPHRINE HCL 10 MG/ML IJ SOLN
INTRAMUSCULAR | Status: AC
Start: 2018-05-16 — End: ?
  Filled 2018-05-16: qty 1

## 2018-05-16 MED ORDER — LIDOCAINE HCL (PF) 2 % IJ SOLN
INTRAMUSCULAR | Status: AC
Start: 2018-05-16 — End: ?
  Filled 2018-05-16: qty 10

## 2018-05-16 MED ORDER — PROPOFOL 10 MG/ML IV BOLUS
INTRAVENOUS | Status: DC | PRN
  Administered 2018-05-16: 40 mg via INTRAVENOUS
  Administered 2018-05-16: 10 mg via INTRAVENOUS
  Administered 2018-05-16: 60 mg via INTRAVENOUS

## 2018-05-16 MED ORDER — LIDOCAINE HCL (CARDIAC) PF 100 MG/5ML IV SOSY
PREFILLED_SYRINGE | INTRAVENOUS | Status: DC | PRN
  Administered 2018-05-16: 50 mg via INTRAVENOUS

## 2018-05-16 MED ORDER — PROPOFOL 500 MG/50ML IV EMUL
INTRAVENOUS | Status: DC | PRN
  Administered 2018-05-16: 130 ug/kg/min via INTRAVENOUS

## 2018-05-16 MED ORDER — ONDANSETRON HCL 4 MG/2ML IJ SOLN
4.0000 mg | Freq: Once | INTRAMUSCULAR | Status: AC
Start: 2018-05-16 — End: 2018-05-16
  Administered 2018-05-16: 4 mg via INTRAVENOUS

## 2018-05-16 MED ORDER — ONDANSETRON HCL 4 MG/2ML IJ SOLN
INTRAMUSCULAR | Status: AC
  Filled 2018-05-16: qty 2

## 2018-05-16 MED ORDER — SODIUM CHLORIDE 0.9 % IV SOLN
INTRAVENOUS | Status: DC
  Administered 2018-05-16: 1000 mL via INTRAVENOUS

## 2018-05-16 MED ORDER — PROPOFOL 500 MG/50ML IV EMUL
INTRAVENOUS | Status: AC
  Filled 2018-05-16: qty 50

## 2018-05-16 NOTE — Op Note (Signed)
[facility] Gastroenterology Patient Name: Monet North Procedure Date: 05/16/2018 8:11 AM MRN: [id number] Account #: [id number] Date of Birth: 06/25/1951 Admit Type: Outpatient Age: 67 Room: [facility] ENDO ROOM 2 Gender: Female Note Status: Finalized Procedure:            Colonoscopy Indications:          Screening for colorectal malignant neoplasm Providers:            Kaytelyn Glore B. Bonna Gains MD, MD Referring MD:         Edmonia Lynch. Aycock MD (Referring MD) Medicines:            Monitored Anesthesia Care Complications:        No immediate complications. Procedure:            Pre-Anesthesia Assessment:                       - ASA Grade Assessment: II - A patient with mild                        systemic disease.                       - Prior to the procedure, a History and Physical was                        performed, and patient medications, allergies and                        sensitivities were reviewed. The patient's tolerance of                        previous anesthesia was reviewed.                       - The risks and benefits of the procedure and the                        sedation options and risks were discussed with the                        patient. All questions were answered and informed                        consent was obtained.                       - Patient identification and proposed procedure were                        verified prior to the procedure by the physician, the                        nurse, the anesthesiologist, the anesthetist and the                        technician. The procedure was verified in the procedure                        room.  After obtaining informed consent, the colonoscope was                        passed under direct vision. Throughout the procedure,                        the patient's blood pressure, pulse, and oxygen                        saturations were monitored continuously. The                     Colonoscope was introduced through the anus and                        advanced to the the cecum, identified by appendiceal                        orifice and ileocecal valve. The colonoscopy was                        performed with ease. The patient tolerated the                        procedure well. The quality of the bowel preparation                        was good. Findings:      The perianal and digital rectal examinations were normal.      A 3 mm polyp was found in the ascending colon. The polyp was flat. The       polyp was removed with a cold biopsy forceps. Resection and retrieval       were complete.      Multiple diverticula were found in the sigmoid colon.      The exam was otherwise without abnormality.      The rectum, sigmoid colon, descending colon, transverse colon, ascending       colon and cecum appeared normal.      Non-bleeding internal hemorrhoids were found during retroflexion. Impression:           - One 3 mm polyp in the ascending colon, removed with a                        cold biopsy forceps. Resected and retrieved.                       - Diverticulosis in the sigmoid colon.                       - The examination was otherwise normal.                       - The rectum, sigmoid colon, descending colon,                        transverse colon, ascending colon and cecum are normal.                       - Non-bleeding internal hemorrhoids. Recommendation:       - Discharge patient to home (with escort).                       -  Advance diet as tolerated.                       - Continue present medications.                       - Await pathology results.                       - Repeat colonoscopy in 5 years for surveillance.                       - The findings and recommendations were discussed with                        the patient.                       - The findings and recommendations were discussed with                        the  patient's family.                       - Return to primary care physician as previously                        scheduled.                       - High fiber diet. Procedure Code(s):    --- Professional ---                       [phone], Colonoscopy, flexible; with biopsy, single or                        multiple Diagnosis Code(s):    --- Professional ---                       Z12.11, Encounter for screening for malignant neoplasm                        of colon                       D12.2, Benign neoplasm of ascending colon                       K64.8, Other hemorrhoids                       K57.30, Diverticulosis of large intestine without                        perforation or abscess without bleeding CPT copyright 2017 American Medical Association. All rights reserved. The codes documented in this report are preliminary and upon coder review may  be revised to meet current compliance requirements.  [name], MD Margretta Sidle B. Bonna Gains MD, MD 05/16/2018 9:12:45 AM This report has been signed electronically. Number of Addenda: 0 Note Initiated On: 05/16/2018 8:11 AM Scope Withdrawal Time: 0 hours 19 minutes 46 seconds  Total Procedure Duration: 0 hours 28 minutes 16 seconds  Estimated Blood Loss: Estimated blood loss: none.  [facility]

## 2018-05-16 NOTE — H&P (Signed)
Emily Antigua, MD [address], [place], [place], [place], [address] [address], [place], [place], [place], [address] Phone: [phone]  Fax: [phone]  Primary Care Physician:  Donnie Coffin, MD   Pre-Procedure History & Physical: HPI:  Emily Berry is a 67 y.o. female is here for a colonoscopy.   Past Medical History:  Diagnosis Date  . Hypothyroidism   . Thyroid disease     Past Surgical History:  Procedure Laterality Date  . ABDOMINAL HYSTERECTOMY    . BACK SURGERY      Prior to Admission medications   Medication Sig Start Date End Date Taking? Authorizing Provider  fluticasone (FLONASE) 50 MCG/ACT nasal spray 2 sprays by Each Nare route daily. 08/11/17 08/11/18 Yes [provider]  ibuprofen (ADVIL,MOTRIN) 800 MG tablet Take 1 tablet (800 mg total) by mouth every 8 (eight) hours as needed. 05/29/17  Yes Laban Emperor, PA-C  Ibuprofen 200 MG CAPS ibuprofen 200mg    Yes [provider]  levothyroxine (SYNTHROID, LEVOTHROID) 50 MCG tablet Take by mouth. 12/19/15  Yes [provider]  losartan-hydrochlorothiazide (HYZAAR) 50-12.5 MG tablet TAKE 1 TABLET BY MOUTH EVERY DAY FOR BLOOD PRESSURE 01/29/11  Yes [provider]  meclizine (ANTIVERT) 25 MG tablet Take by mouth. 10/31/17  Yes [provider]  meloxicam (MOBIC) 7.5 MG tablet TAKE 1 TABLET BY MOUTH DAILY WITH FOOD IN THE MORNING 06/11/17  Yes [provider]  omeprazole (PRILOSEC) 20 MG capsule TAKE ONE CAPSULE BY MOUTH ONCE DAILY FOR REFLUX 02/28/16  Yes [provider]  pregabalin (LYRICA) 75 MG capsule Take by mouth. 02/29/16  Yes [provider]  simvastatin (ZOCOR) 40 MG tablet Take by mouth. 01/24/16  Yes [provider]  butalbital-acetaminophen-caffeine (FIORICET, ESGIC) 50-325-40 MG tablet TAKE 1 TO 2 TABLETS BY MOUTH EVERY 4 HOURS AS NEEDED FOR HEADACHE. DO NOT EXCEET 6 TAB PER 24 HOUR. 02/23/16   [provider]    cyclobenzaprine (FLEXERIL) 10 MG tablet TAKE 1/2 TO 1 TABLET BY MOUTH AT BEDTIME 02/28/16   [provider]  diclofenac sodium (VOLTAREN) 1 % GEL Apply topically. 12/08/17   [provider]  DULoxetine (CYMBALTA) 30 MG capsule TAKE ONE CAPSULE BY MOUTH ONCE DAILY FOR PAIN 01/24/16   [provider]  gabapentin (NEURONTIN) 300 MG capsule Take by mouth. 10/10/17   [provider]  lidocaine (LIDODERM) 5 % Place 1 patch onto the skin daily. Remove & Discard patch within 12 hours or as directed by MD Patient not taking: Reported on 05/16/2018 05/29/17 05/29/18  Laban Emperor, PA-C  predniSONE (DELTASONE) 50 MG tablet Take one tablet by mouth for the next five days. Patient not taking: Reported on 05/16/2018 08/04/17   Lannie Fields, PA-C    Allergies as of 05/05/2018 - Review Complete 08/04/2017  Allergen Reaction Noted  . Codeine Nausea Only 05/29/2017    Family History  Problem Relation Age of Onset  . Cancer Mother 29       cervical  . Cancer Brother        sinus, bone ca  . Breast cancer Neg Hx     Social History   Socioeconomic History  . Marital status: Divorced    Spouse name: Not on file  . Number of children: Not on file  . Years of education: Not on file  . Highest education level: Not on file  Occupational History  . Not on file  Social Needs  . Financial resource strain: Not  on file  . Food insecurity:    Worry: Not on file    Inability: Not on file  . Transportation needs:    Medical: Not on file    Non-medical: Not on file  Tobacco Use  . Smoking status: Never Smoker  . Smokeless tobacco: Never Used  Substance and Sexual Activity  . Alcohol use: No  . Drug use: No  . Sexual activity: Not on file  Lifestyle  . Physical activity:    Days per week: Not on file    Minutes per session: Not on file  . Stress: Not on file  Relationships  . Social connections:    Talks on phone: Not on file    Gets together: Not on file     Attends religious service: Not on file    Active member of club or organization: Not on file    Attends meetings of clubs or organizations: Not on file    Relationship status: Not on file  . Intimate partner violence:    Fear of current or ex partner: Not on file    Emotionally abused: Not on file    Physically abused: Not on file    Forced sexual activity: Not on file  Other Topics Concern  . Not on file  Social History Narrative  . Not on file    Review of Systems: See HPI, otherwise negative ROS  Physical Exam: BP 139/78   Pulse 82   Temp (!) 96.7 F (35.9 C) (Tympanic)   Resp 16   Ht 4\' 11"  (1.499 m)   Wt 159 lb (72.1 kg)   SpO2 98%   BMI 32.11 kg/m  General:   Alert,  pleasant and cooperative in NAD Head:  Normocephalic and atraumatic. Neck:  Supple; no masses or thyromegaly. Lungs:  Clear throughout to auscultation, normal respiratory effort.    Heart:  +S1, +S2, Regular rate and rhythm, No edema. Abdomen:  Soft, nontender and nondistended. Normal bowel sounds, without guarding, and without rebound.   Neurologic:  Alert and  oriented x4;  grossly normal neurologically.  Impression/Plan: [name] is here for a colonoscopy to be performed for average risk screening.  Risks, benefits, limitations, and alternatives regarding  colonoscopy have been reviewed with the patient.  Questions have been answered.  All parties agreeable.   Virgel Manifold, MD  05/16/2018, 8:07 AM

## 2018-05-16 NOTE — Anesthesia Postprocedure Evaluation (Signed)
Anesthesia Post Note  Patient: Emily Berry  Procedure(s) Performed: COLONOSCOPY WITH PROPOFOL (N/A )  Patient location during evaluation: Endoscopy Anesthesia Type: General Level of consciousness: awake and alert Pain management: pain level controlled Vital Signs Assessment: post-procedure vital signs reviewed and stable Respiratory status: spontaneous breathing and respiratory function stable Cardiovascular status: stable Anesthetic complications: no     Last Vitals:  Vitals:   05/16/18 0932 05/16/18 0942  BP: (!) 96/50 118/78  Pulse: 72 68  Resp: 13 13  Temp:    SpO2: 100% 100%    Last Pain:  Vitals:   05/16/18 0942  TempSrc:   PainSc: 0-No pain                 KEPHART,WILLIAM K

## 2018-05-16 NOTE — Anesthesia Procedure Notes (Signed)
Performed by: Kosisochukwu Burningham, CRNA Pre-anesthesia Checklist: Patient identified, Emergency Drugs available, Suction available, Patient being monitored and Timeout performed Patient Re-evaluated:Patient Re-evaluated prior to induction Oxygen Delivery Method: Nasal cannula Induction Type: IV induction       

## 2018-05-16 NOTE — Transfer of Care (Signed)
Immediate Anesthesia Transfer of Care Note  Patient: Emily Berry  Procedure(s) Performed: COLONOSCOPY WITH PROPOFOL (N/A )  Patient Location: PACU  Anesthesia Type:General  Level of Consciousness: sedated and responds to stimulation  Airway & Oxygen Therapy: Patient Spontanous Breathing and Patient connected to nasal cannula oxygen  Post-op Assessment: Report given to RN and Post -op Vital signs reviewed and stable  Post vital signs: Reviewed and stable  Last Vitals:  Vitals Value Taken Time  BP 114/59 05/16/2018  9:13 AM  Temp 36.4 C 05/16/2018  9:12 AM  Pulse 78 05/16/2018  9:13 AM  Resp 14 05/16/2018  9:13 AM  SpO2 98 % 05/16/2018  9:13 AM    Last Pain:  Vitals:   05/16/18 0912  TempSrc: Tympanic  PainSc: Asleep         Complications: No apparent anesthesia complications

## 2018-05-16 NOTE — Anesthesia Preprocedure Evaluation (Signed)
Anesthesia Evaluation  Patient identified by MRN, date of birth, ID band Patient awake    Reviewed: Allergy & Precautions, NPO status , Patient's Chart, lab work & pertinent test results  History of Anesthesia Complications (+) PONVNegative for: history of anesthetic complications  Airway Mallampati: III       Dental   Pulmonary neg sleep apnea, neg COPD,           Cardiovascular hypertension, Pt. on medications (-) Past MI and (-) CHF (-) dysrhythmias (-) Valvular Problems/Murmurs     Neuro/Psych neg Seizures    GI/Hepatic Neg liver ROS, GERD  Medicated and Controlled,  Endo/Other  neg diabetesHypothyroidism   Renal/GU negative Renal ROS     Musculoskeletal   Abdominal   Peds  Hematology   Anesthesia Other Findings   Reproductive/Obstetrics                             Anesthesia Physical Anesthesia Plan  ASA: II  Anesthesia Plan: General   Post-op Pain Management:    Induction: Intravenous  PONV Risk Score and Plan: 4 or greater and TIVA, Propofol infusion and Treatment may vary due to age or medical condition  Airway Management Planned: Nasal Cannula  Additional Equipment:   Intra-op Plan:   Post-operative Plan:   Informed Consent: I have reviewed the patients History and Physical, chart, labs and discussed the procedure including the risks, benefits and alternatives for the proposed anesthesia with the patient or authorized representative who has indicated his/her understanding and acceptance.     Plan Discussed with:   Anesthesia Plan Comments:         Anesthesia Quick Evaluation

## 2018-05-16 NOTE — Anesthesia Post-op Follow-up Note (Signed)
Anesthesia QCDR form completed.        

## 2018-05-20 ENCOUNTER — Encounter: Payer: Self-pay | Admitting: Gastroenterology

## 2018-05-20 LAB — SURGICAL PATHOLOGY

## 2018-05-22 ENCOUNTER — Encounter: Payer: Self-pay | Admitting: Gastroenterology

## 2018-07-01 ENCOUNTER — Other Ambulatory Visit: Payer: Self-pay | Admitting: Family Medicine

## 2018-07-01 DIAGNOSIS — Z1231 Encounter for screening mammogram for malignant neoplasm of breast: Secondary | ICD-10-CM

## 2018-07-30 ENCOUNTER — Ambulatory Visit
Admission: RE | Admit: 2018-07-30 | Discharge: 2018-07-30 | Disposition: A | Discharge status: Home / Self Care | Payer: Medicare HMO | Source: Ambulatory Visit | Attending: Family Medicine | Admitting: Family Medicine

## 2018-07-30 DIAGNOSIS — Z1231 Encounter for screening mammogram for malignant neoplasm of breast: Secondary | ICD-10-CM | POA: Diagnosis not present

## 2019-10-16 ENCOUNTER — Other Ambulatory Visit: Payer: Self-pay | Admitting: Family Medicine

## 2019-10-16 DIAGNOSIS — Z1231 Encounter for screening mammogram for malignant neoplasm of breast: Secondary | ICD-10-CM

## 2020-01-11 ENCOUNTER — Ambulatory Visit
Admission: RE | Admit: 2020-01-11 | Discharge: 2020-01-11 | Disposition: A | Discharge status: Home / Self Care | Payer: Medicare HMO | Source: Ambulatory Visit | Attending: Family Medicine | Admitting: Family Medicine

## 2020-01-11 DIAGNOSIS — Z1231 Encounter for screening mammogram for malignant neoplasm of breast: Secondary | ICD-10-CM | POA: Diagnosis present

## 2020-03-29 ENCOUNTER — Ambulatory Visit: Payer: Medicare HMO | Attending: Neurology

## 2020-03-29 DIAGNOSIS — G4730 Sleep apnea, unspecified: Secondary | ICD-10-CM | POA: Diagnosis present

## 2020-03-29 DIAGNOSIS — G4733 Obstructive sleep apnea (adult) (pediatric): Secondary | ICD-10-CM | POA: Insufficient documentation

## 2020-03-30 ENCOUNTER — Other Ambulatory Visit: Payer: Self-pay

## 2020-05-18 ENCOUNTER — Other Ambulatory Visit: Payer: Self-pay | Admitting: Family Medicine

## 2020-05-18 ENCOUNTER — Ambulatory Visit
Admission: RE | Admit: 2020-05-18 | Discharge: 2020-05-18 | Disposition: A | Discharge status: Home / Self Care | Payer: Medicare HMO | Source: Ambulatory Visit | Attending: Family Medicine | Admitting: Family Medicine

## 2020-05-18 DIAGNOSIS — M25562 Pain in left knee: Secondary | ICD-10-CM

## 2020-05-18 DIAGNOSIS — M79672 Pain in left foot: Secondary | ICD-10-CM

## 2020-05-18 DIAGNOSIS — M25561 Pain in right knee: Secondary | ICD-10-CM | POA: Diagnosis present

## 2020-06-02 ENCOUNTER — Ambulatory Visit: Payer: Medicare HMO | Attending: Neurology

## 2020-06-02 DIAGNOSIS — G4733 Obstructive sleep apnea (adult) (pediatric): Secondary | ICD-10-CM | POA: Diagnosis not present

## 2020-06-03 ENCOUNTER — Other Ambulatory Visit: Payer: Self-pay

## 2020-09-01 ENCOUNTER — Other Ambulatory Visit: Payer: Self-pay | Admitting: Family Medicine

## 2020-09-01 DIAGNOSIS — R221 Localized swelling, mass and lump, neck: Secondary | ICD-10-CM

## 2020-09-07 ENCOUNTER — Ambulatory Visit
Admission: RE | Admit: 2020-09-07 | Discharge: 2020-09-07 | Disposition: A | Discharge status: Home / Self Care | Payer: Medicare HMO | Source: Ambulatory Visit | Attending: Family Medicine | Admitting: Family Medicine

## 2020-09-07 ENCOUNTER — Other Ambulatory Visit: Payer: Self-pay

## 2020-09-07 DIAGNOSIS — R221 Localized swelling, mass and lump, neck: Secondary | ICD-10-CM | POA: Insufficient documentation

## 2020-09-16 ENCOUNTER — Ambulatory Visit
Admission: RE | Admit: 2020-09-16 | Discharge: 2020-09-16 | Disposition: A | Discharge status: Home / Self Care | Payer: Medicare HMO | Source: Ambulatory Visit | Attending: Family Medicine | Admitting: Family Medicine

## 2020-09-16 ENCOUNTER — Other Ambulatory Visit: Payer: Self-pay | Admitting: Family Medicine

## 2020-09-16 DIAGNOSIS — M79642 Pain in left hand: Secondary | ICD-10-CM | POA: Diagnosis present

## 2020-09-16 DIAGNOSIS — M79641 Pain in right hand: Secondary | ICD-10-CM | POA: Insufficient documentation

## 2020-12-01 ENCOUNTER — Other Ambulatory Visit: Payer: Self-pay | Admitting: Family Medicine

## 2020-12-01 DIAGNOSIS — Z1231 Encounter for screening mammogram for malignant neoplasm of breast: Secondary | ICD-10-CM

## 2020-12-07 ENCOUNTER — Ambulatory Visit (INDEPENDENT_AMBULATORY_CARE_PROVIDER_SITE_OTHER): Payer: Medicare HMO | Admitting: Dermatology

## 2020-12-07 ENCOUNTER — Other Ambulatory Visit: Payer: Self-pay

## 2020-12-07 DIAGNOSIS — L821 Other seborrheic keratosis: Secondary | ICD-10-CM | POA: Diagnosis not present

## 2020-12-07 DIAGNOSIS — L578 Other skin changes due to chronic exposure to nonionizing radiation: Secondary | ICD-10-CM

## 2020-12-07 DIAGNOSIS — L820 Inflamed seborrheic keratosis: Secondary | ICD-10-CM | POA: Diagnosis not present

## 2020-12-07 NOTE — Progress Notes (Signed)
   Follow-Up Visit   Subjective  Emily Berry is a 69 y.o. female who presents for the following: Other (Spots on back and left breast that are itchy and some are peeling off/).  The following portions of the chart were reviewed this encounter and updated as appropriate:   Tobacco  Allergies  Meds  Problems  Med Hx  Surg Hx  Fam Hx     Review of Systems:  No other skin or systemic complaints except as noted in HPI or Assessment and Plan.  Objective  Well appearing patient in no apparent distress; mood and affect are within normal limits.  A focused examination was performed including face, neck, chest and back. Relevant physical exam findings are noted in the Assessment and Plan.  Objective  Back x 15, left breast x 1, left cheek x 1 (17): Erythematous keratotic or waxy stuck-on papule or plaque.    Assessment & Plan    Actinic Damage - chronic, secondary to cumulative UV radiation exposure/sun exposure over time - diffuse scaly erythematous macules with underlying dyspigmentation - Recommend daily broad spectrum sunscreen SPF 30+ to sun-exposed areas, reapply every 2 hours as needed.  - Call for new or changing lesions.   Seborrheic Keratoses - Stuck-on, waxy, tan-brown papules and plaques  - Discussed benign etiology and prognosis. - Observe - Call for any changes  Inflamed seborrheic keratosis (17) Back x 15, left breast x 1, left cheek x 1  Destruction of lesion - Back x 15, left breast x 1, left cheek x 1 Complexity: simple   Destruction method: cryotherapy   Informed consent: discussed and consent obtained   Timeout:  patient name, date of birth, surgical site, and procedure verified Lesion destroyed using liquid nitrogen: Yes   Region frozen until ice ball extended beyond lesion: Yes   Outcome: patient tolerated procedure well with no complications   Post-procedure details: wound care instructions given    Return in about 3 months (around  03/07/2021).  I, Ashok Cordia, CMA, am acting as scribe for Sarina Ser, MD .  Documentation: I have reviewed the above documentation for accuracy and completeness, and I agree with the above.  Sarina Ser, MD

## 2020-12-07 NOTE — Patient Instructions (Signed)

## 2020-12-12 ENCOUNTER — Encounter: Payer: Self-pay | Admitting: Dermatology

## 2021-01-11 ENCOUNTER — Ambulatory Visit
Admission: RE | Admit: 2021-01-11 | Discharge: 2021-01-11 | Disposition: A | Discharge status: Home / Self Care | Payer: Medicare HMO | Source: Ambulatory Visit | Attending: Family Medicine | Admitting: Family Medicine

## 2021-01-11 ENCOUNTER — Other Ambulatory Visit: Payer: Self-pay

## 2021-01-11 DIAGNOSIS — Z1231 Encounter for screening mammogram for malignant neoplasm of breast: Secondary | ICD-10-CM | POA: Diagnosis present

## 2021-03-08 ENCOUNTER — Ambulatory Visit: Payer: Medicare HMO | Admitting: Dermatology

## 2021-07-12 ENCOUNTER — Ambulatory Visit: Payer: Medicare HMO | Admitting: Dermatology

## 2021-09-28 ENCOUNTER — Other Ambulatory Visit: Payer: Self-pay | Admitting: Family Medicine

## 2021-09-28 DIAGNOSIS — Z1231 Encounter for screening mammogram for malignant neoplasm of breast: Secondary | ICD-10-CM

## 2021-10-02 ENCOUNTER — Other Ambulatory Visit: Payer: Self-pay

## 2021-10-02 ENCOUNTER — Ambulatory Visit (INDEPENDENT_AMBULATORY_CARE_PROVIDER_SITE_OTHER): Payer: Medicare HMO | Admitting: Dermatology

## 2021-10-02 DIAGNOSIS — L918 Other hypertrophic disorders of the skin: Secondary | ICD-10-CM

## 2021-10-02 DIAGNOSIS — D229 Melanocytic nevi, unspecified: Secondary | ICD-10-CM

## 2021-10-02 DIAGNOSIS — L578 Other skin changes due to chronic exposure to nonionizing radiation: Secondary | ICD-10-CM | POA: Diagnosis not present

## 2021-10-02 DIAGNOSIS — Z1283 Encounter for screening for malignant neoplasm of skin: Secondary | ICD-10-CM | POA: Diagnosis not present

## 2021-10-02 DIAGNOSIS — L821 Other seborrheic keratosis: Secondary | ICD-10-CM

## 2021-10-02 DIAGNOSIS — Z86018 Personal history of other benign neoplasm: Secondary | ICD-10-CM

## 2021-10-02 DIAGNOSIS — L814 Other melanin hyperpigmentation: Secondary | ICD-10-CM

## 2021-10-02 DIAGNOSIS — D1800 Hemangioma unspecified site: Secondary | ICD-10-CM

## 2021-10-02 NOTE — Progress Notes (Signed)
   Follow-Up Visit   Subjective  Emily Berry is a 70 y.o. female who presents for the following: Annual Exam (Hx dysplastic nevus and ISK's ). The patient presents for Total-Body Skin Exam (TBSE) for skin cancer screening and mole check.  The following portions of the chart were reviewed this encounter and updated as appropriate:   Tobacco  Allergies  Meds  Problems  Med Hx  Surg Hx  Fam Hx     Review of Systems:  No other skin or systemic complaints except as noted in HPI or Assessment and Plan.  Objective  Well appearing patient in no apparent distress; mood and affect are within normal limits.  A full examination was performed including scalp, head, eyes, ears, nose, lips, neck, chest, axillae, abdomen, back, buttocks, bilateral upper extremities, bilateral lower extremities, hands, feet, fingers, toes, fingernails, and toenails. All findings within normal limits unless otherwise noted below.  RLQA Pink biopsy site.  Assessment & Plan  History of dysplastic nevus RLQA  Clear. Observe for recurrence. Call clinic for new or changing lesions.  Recommend regular skin exams, daily broad-spectrum spf 30+ sunscreen use, and photoprotection.     Skin cancer screening  Lentigines - Scattered tan macules - Due to sun exposure - Benign-appearing, observe - Recommend daily broad spectrum sunscreen SPF 30+ to sun-exposed areas, reapply every 2 hours as needed. - Call for any changes  Seborrheic Keratoses - Stuck-on, waxy, tan-brown papules and/or plaques  - Benign-appearing - Discussed benign etiology and prognosis. - Observe - Call for any changes  Melanocytic Nevi - Tan-brown and/or pink-flesh-colored symmetric macules and papules - Benign appearing on exam today - Observation - Call clinic for new or changing moles - Recommend daily use of broad spectrum spf 30+ sunscreen to sun-exposed areas.   Hemangiomas - Red papules - Discussed benign nature - Observe -  Call for any changes  Actinic Damage - Chronic condition, secondary to cumulative UV/sun exposure - diffuse scaly erythematous macules with underlying dyspigmentation - Recommend daily broad spectrum sunscreen SPF 30+ to sun-exposed areas, reapply every 2 hours as needed.  - Staying in the shade or wearing long sleeves, sun glasses (UVA+UVB protection) and wide brim hats (4-inch brim around the entire circumference of the hat) are also recommended for sun protection.  - Call for new or changing lesions.  Acrochordons (Skin Tags) - Fleshy, skin-colored pedunculated papules - Benign appearing.  - Observe. - If desired, they can be removed with an in office procedure that is not covered by insurance. - Please call the clinic if you notice any new or changing lesions.  Skin cancer screening performed today.  Return in about 1 year (around 10/02/2022) for TBSE.  Luther Redo, CMA, am acting as scribe for Sarina Ser, MD . Documentation: I have reviewed the above documentation for accuracy and completeness, and I agree with the above.  Sarina Ser, MD

## 2021-10-02 NOTE — Patient Instructions (Signed)

## 2021-10-03 ENCOUNTER — Encounter: Payer: Self-pay | Admitting: Dermatology

## 2021-10-12 ENCOUNTER — Other Ambulatory Visit: Payer: Self-pay | Admitting: Family Medicine

## 2021-10-12 DIAGNOSIS — Z13820 Encounter for screening for osteoporosis: Secondary | ICD-10-CM

## 2021-10-17 ENCOUNTER — Ambulatory Visit
Admission: RE | Admit: 2021-10-17 | Discharge: 2021-10-17 | Disposition: A | Discharge status: Home / Self Care | Payer: Medicare HMO | Source: Ambulatory Visit | Attending: Family Medicine | Admitting: Family Medicine

## 2021-10-17 ENCOUNTER — Other Ambulatory Visit: Payer: Self-pay

## 2021-10-17 DIAGNOSIS — M8589 Other specified disorders of bone density and structure, multiple sites: Secondary | ICD-10-CM | POA: Diagnosis not present

## 2021-10-17 DIAGNOSIS — Z13820 Encounter for screening for osteoporosis: Secondary | ICD-10-CM | POA: Diagnosis not present

## 2021-10-17 DIAGNOSIS — Z780 Asymptomatic menopausal state: Secondary | ICD-10-CM | POA: Diagnosis not present

## 2021-10-17 DIAGNOSIS — Z8262 Family history of osteoporosis: Secondary | ICD-10-CM | POA: Diagnosis not present

## 2022-01-16 ENCOUNTER — Other Ambulatory Visit: Payer: Self-pay

## 2022-01-16 ENCOUNTER — Ambulatory Visit
Admission: RE | Admit: 2022-01-16 | Discharge: 2022-01-16 | Disposition: A | Discharge status: Home / Self Care | Payer: Medicare HMO | Source: Ambulatory Visit | Attending: Family Medicine | Admitting: Family Medicine

## 2022-01-16 DIAGNOSIS — Z1231 Encounter for screening mammogram for malignant neoplasm of breast: Secondary | ICD-10-CM | POA: Diagnosis present

## 2022-04-27 ENCOUNTER — Other Ambulatory Visit: Payer: Self-pay | Admitting: Physician Assistant

## 2022-04-27 DIAGNOSIS — R262 Difficulty in walking, not elsewhere classified: Secondary | ICD-10-CM

## 2022-04-27 DIAGNOSIS — R202 Paresthesia of skin: Secondary | ICD-10-CM

## 2022-04-27 DIAGNOSIS — M79672 Pain in left foot: Secondary | ICD-10-CM

## 2022-04-27 DIAGNOSIS — G8929 Other chronic pain: Secondary | ICD-10-CM

## 2022-04-30 ENCOUNTER — Ambulatory Visit
Admission: RE | Admit: 2022-04-30 | Discharge: 2022-04-30 | Disposition: A | Discharge status: Home / Self Care | Payer: Medicare HMO | Source: Ambulatory Visit | Attending: Physician Assistant | Admitting: Physician Assistant

## 2022-04-30 DIAGNOSIS — R262 Difficulty in walking, not elsewhere classified: Secondary | ICD-10-CM | POA: Diagnosis present

## 2022-04-30 DIAGNOSIS — G8929 Other chronic pain: Secondary | ICD-10-CM | POA: Insufficient documentation

## 2022-04-30 DIAGNOSIS — R200 Anesthesia of skin: Secondary | ICD-10-CM | POA: Insufficient documentation

## 2022-04-30 DIAGNOSIS — M5441 Lumbago with sciatica, right side: Secondary | ICD-10-CM | POA: Diagnosis present

## 2022-04-30 DIAGNOSIS — R202 Paresthesia of skin: Secondary | ICD-10-CM | POA: Insufficient documentation

## 2022-04-30 DIAGNOSIS — M5442 Lumbago with sciatica, left side: Secondary | ICD-10-CM | POA: Insufficient documentation

## 2022-04-30 DIAGNOSIS — M79672 Pain in left foot: Secondary | ICD-10-CM | POA: Insufficient documentation

## 2022-07-03 ENCOUNTER — Other Ambulatory Visit: Payer: Self-pay | Admitting: Internal Medicine

## 2022-07-03 DIAGNOSIS — M5412 Radiculopathy, cervical region: Secondary | ICD-10-CM

## 2022-07-11 ENCOUNTER — Ambulatory Visit: Admission: RE | Admit: 2022-07-11 | Payer: Medicare HMO | Source: Ambulatory Visit

## 2022-09-03 ENCOUNTER — Observation Stay: Payer: Medicare HMO

## 2022-09-03 ENCOUNTER — Encounter: Payer: Self-pay | Admitting: Internal Medicine

## 2022-09-03 ENCOUNTER — Emergency Department: Payer: Medicare HMO

## 2022-09-03 ENCOUNTER — Observation Stay
Admission: EM | Admit: 2022-09-03 | Discharge: 2022-09-04 | Disposition: A | Discharge status: Home / Self Care | Payer: Medicare HMO | Attending: Internal Medicine | Admitting: Internal Medicine

## 2022-09-03 ENCOUNTER — Other Ambulatory Visit: Payer: Self-pay

## 2022-09-03 DIAGNOSIS — E039 Hypothyroidism, unspecified: Secondary | ICD-10-CM | POA: Diagnosis present

## 2022-09-03 DIAGNOSIS — R079 Chest pain, unspecified: Secondary | ICD-10-CM | POA: Diagnosis present

## 2022-09-03 DIAGNOSIS — E785 Hyperlipidemia, unspecified: Secondary | ICD-10-CM | POA: Diagnosis present

## 2022-09-03 DIAGNOSIS — M5412 Radiculopathy, cervical region: Secondary | ICD-10-CM | POA: Diagnosis not present

## 2022-09-03 DIAGNOSIS — F32A Depression, unspecified: Secondary | ICD-10-CM | POA: Diagnosis present

## 2022-09-03 DIAGNOSIS — R0789 Other chest pain: Principal | ICD-10-CM | POA: Insufficient documentation

## 2022-09-03 DIAGNOSIS — R911 Solitary pulmonary nodule: Secondary | ICD-10-CM | POA: Diagnosis present

## 2022-09-03 DIAGNOSIS — Z79899 Other long term (current) drug therapy: Secondary | ICD-10-CM | POA: Insufficient documentation

## 2022-09-03 DIAGNOSIS — I10 Essential (primary) hypertension: Secondary | ICD-10-CM | POA: Diagnosis present

## 2022-09-03 LAB — CBC
HCT: 43.5 % (ref 36.0–46.0)
Hemoglobin: 13.6 g/dL (ref 12.0–15.0)
MCH: 26.6 pg (ref 26.0–34.0)
MCHC: 31.3 g/dL (ref 30.0–36.0)
MCV: 85 fL (ref 80.0–100.0)
Platelets: 301 10*3/uL (ref 150–400)
RBC: 5.12 MIL/uL — ABNORMAL HIGH (ref 3.87–5.11)
RDW: 15.8 % — ABNORMAL HIGH (ref 11.5–15.5)
WBC: 11.7 10*3/uL — ABNORMAL HIGH (ref 4.0–10.5)
nRBC: 0 % (ref 0.0–0.2)

## 2022-09-03 LAB — LIPID PANEL
Cholesterol: 188 mg/dL (ref 0–200)
HDL: 42 mg/dL (ref >40)
LDL Cholesterol: 72 mg/dL (ref 0–99)
Total CHOL/HDL Ratio: 4.5 RATIO
Triglycerides: 370 mg/dL — ABNORMAL HIGH (ref <150)
VLDL: 74 mg/dL — ABNORMAL HIGH (ref 0–40)

## 2022-09-03 LAB — BASIC METABOLIC PANEL
Anion gap: 8 (ref 5–15)
BUN: 17 mg/dL (ref 8–23)
CO2: 27 mmol/L (ref 22–32)
Calcium: 9.2 mg/dL (ref 8.9–10.3)
Chloride: 103 mmol/L (ref 98–111)
Creatinine, Ser: 0.92 mg/dL (ref 0.44–1.00)
GFR, Estimated: >60 mL/min (ref >60)
Glucose, Bld: 105 mg/dL — ABNORMAL HIGH (ref 70–99)
Potassium: 3.8 mmol/L (ref 3.5–5.1)
Sodium: 138 mmol/L (ref 135–145)

## 2022-09-03 LAB — PROTIME-INR
INR: 1.1 (ref 0.8–1.2)
Prothrombin Time: 13.7 seconds (ref 11.4–15.2)

## 2022-09-03 LAB — HEMOGLOBIN A1C
Hgb A1c MFr Bld: 5.8 % — ABNORMAL HIGH (ref 4.8–5.6)
Mean Plasma Glucose: 119.76 mg/dL

## 2022-09-03 LAB — TROPONIN I (HIGH SENSITIVITY): Troponin I (High Sensitivity): 3 ng/L (ref <18)

## 2022-09-03 LAB — APTT: aPTT: 29 seconds (ref 24–36)

## 2022-09-03 MED ORDER — LABETALOL HCL 5 MG/ML IV SOLN: 5.0000 mg | INTRAVENOUS | Status: DC | PRN

## 2022-09-03 MED ORDER — ASPIRIN 81 MG PO TBEC
81.0000 mg | DELAYED_RELEASE_TABLET | Freq: Every day | ORAL | Status: DC
  Administered 2022-09-04: 81 mg via ORAL
  Filled 2022-09-03: qty 1

## 2022-09-03 MED ORDER — ASPIRIN 81 MG PO CHEW
324.0000 mg | CHEWABLE_TABLET | Freq: Once | ORAL | Status: AC
  Administered 2022-09-03: 324 mg via ORAL
  Filled 2022-09-03: qty 4

## 2022-09-03 MED ORDER — PANTOPRAZOLE SODIUM 40 MG PO TBEC
40.0000 mg | DELAYED_RELEASE_TABLET | Freq: Every day | ORAL | Status: DC
  Administered 2022-09-03 – 2022-09-04 (×2): 40 mg via ORAL
  Filled 2022-09-03 (×2): qty 1

## 2022-09-03 MED ORDER — BACLOFEN 10 MG PO TABS: 10.0000 mg | ORAL_TABLET | Freq: Two times a day (BID) | ORAL | Status: DC | PRN

## 2022-09-03 MED ORDER — ENOXAPARIN SODIUM 40 MG/0.4ML IJ SOSY
40.0000 mg | PREFILLED_SYRINGE | INTRAMUSCULAR | Status: DC
  Administered 2022-09-03: 40 mg via SUBCUTANEOUS
  Filled 2022-09-03: qty 0.4

## 2022-09-03 MED ORDER — LOSARTAN POTASSIUM-HCTZ 50-12.5 MG PO TABS: 1.0000 | ORAL_TABLET | Freq: Every day | ORAL | Status: DC

## 2022-09-03 MED ORDER — MORPHINE SULFATE (PF) 2 MG/ML IV SOLN: 1.0000 mg | INTRAVENOUS | Status: DC | PRN

## 2022-09-03 MED ORDER — NITROGLYCERIN 0.4 MG SL SUBL: 0.4000 mg | SUBLINGUAL_TABLET | SUBLINGUAL | Status: DC | PRN

## 2022-09-03 MED ORDER — ONDANSETRON HCL 4 MG/2ML IJ SOLN: 4.0000 mg | Freq: Three times a day (TID) | INTRAMUSCULAR | Status: DC | PRN

## 2022-09-03 MED ORDER — IOHEXOL 350 MG/ML SOLN
100.0000 mL | Freq: Once | INTRAVENOUS | Status: AC | PRN
  Administered 2022-09-03: 100 mL via INTRAVENOUS

## 2022-09-03 MED ORDER — PREGABALIN 75 MG PO CAPS
150.0000 mg | ORAL_CAPSULE | Freq: Two times a day (BID) | ORAL | Status: DC
  Administered 2022-09-03 – 2022-09-04 (×2): 150 mg via ORAL
  Filled 2022-09-03 (×2): qty 2

## 2022-09-03 MED ORDER — SODIUM CHLORIDE 0.9 % IV SOLN: INTRAVENOUS | Status: DC

## 2022-09-03 MED ORDER — OYSTER SHELL CALCIUM/D3 500-5 MG-MCG PO TABS
1.0000 | ORAL_TABLET | Freq: Two times a day (BID) | ORAL | Status: DC
  Administered 2022-09-03 – 2022-09-04 (×2): 1 via ORAL
  Filled 2022-09-03 (×2): qty 1

## 2022-09-03 MED ORDER — LOSARTAN POTASSIUM 50 MG PO TABS
50.0000 mg | ORAL_TABLET | Freq: Every day | ORAL | Status: DC
  Administered 2022-09-04: 50 mg via ORAL
  Filled 2022-09-03: qty 1

## 2022-09-03 MED ORDER — SIMVASTATIN 20 MG PO TABS
40.0000 mg | ORAL_TABLET | Freq: Every day | ORAL | Status: DC
  Administered 2022-09-03 – 2022-09-04 (×2): 40 mg via ORAL
  Filled 2022-09-03 (×2): qty 2

## 2022-09-03 MED ORDER — LEVOTHYROXINE SODIUM 50 MCG PO TABS
50.0000 ug | ORAL_TABLET | Freq: Every day | ORAL | Status: DC
  Administered 2022-09-04: 50 ug via ORAL
  Filled 2022-09-03: qty 1

## 2022-09-03 MED ORDER — ACETAMINOPHEN 325 MG PO TABS: 650.0000 mg | ORAL_TABLET | Freq: Four times a day (QID) | ORAL | Status: DC | PRN

## 2022-09-03 MED ORDER — DULOXETINE HCL 30 MG PO CPEP
60.0000 mg | ORAL_CAPSULE | Freq: Every day | ORAL | Status: DC
  Administered 2022-09-03 – 2022-09-04 (×2): 60 mg via ORAL
  Filled 2022-09-03 (×2): qty 2

## 2022-09-03 MED ORDER — HYDROCHLOROTHIAZIDE 12.5 MG PO TABS
12.5000 mg | ORAL_TABLET | Freq: Every day | ORAL | Status: DC
  Administered 2022-09-04: 12.5 mg via ORAL
  Filled 2022-09-03: qty 1

## 2022-09-03 NOTE — Assessment & Plan Note (Signed)
CAT negative for dissection or central PE.  Initial troponin 3.  Consulted Dr. Saralyn Pilar of cardiology  - place to tele bed for observation - Trend Trop - Repeat EKG in the am  - prn Nitroglycerin, Morphine, and aspirin, Zocor - Risk factor stratification: will check FLP and A1C  - check UDS

## 2022-09-03 NOTE — Assessment & Plan Note (Signed)
-   Continue home medications 

## 2022-09-03 NOTE — ED Provider Notes (Signed)
[facility] Provider Note    Event Date/Time   First MD Initiated Contact with Patient 09/03/22 1538     (approximate)   History   Chief Complaint: Chest Pain (Chest pain started pasy month also radiated to neck, jaw)   HPI  Emily Berry is a 71 y.o. female with a history of hypertension, hyperlipidemia, diverticulosis, angina pectoris, hypothyroidism who comes the ED with chest pain radiating to the back, neck, and jaw that started 3 days ago.  Intermittent episodes lasting several minutes at a time.  This feels like her angina that she has had for many months, but over the last 3 days it is occurring much more frequently.  It is worse with exertion.  Associated with shortness of breath and diaphoresis.     Physical Exam   Triage Vital Signs: ED Triage Vitals  Enc Vitals Group     BP 09/03/22 1257 135/89     Pulse Rate 09/03/22 1257 93     Resp 09/03/22 1257 18     Temp 09/03/22 1257 98.5 F (36.9 C)     Temp Source 09/03/22 1257 Oral     SpO2 09/03/22 1257 93 %     Weight 09/03/22 1257 159 lb (72.1 kg)     Height 09/03/22 1257 '4\' 11"'$  (1.499 m)     Head Circumference --      Peak Flow --      Pain Score 09/03/22 1300 9     Pain Loc --      Pain Edu? --      Excl. in [place]? --     Most recent vital signs: Vitals:   09/03/22 1257 09/03/22 1431  BP: 135/89 (!) 150/80  Pulse: 93 85  Resp: 18 18  Temp: 98.5 F (36.9 C) 98.5 F (36.9 C)  SpO2: 93% 98%    General: Awake, no distress.  CV:  Good peripheral perfusion.  Regular rate and rhythm.  Symmetric peripheral pulses Resp:  Normal effort.  Clear to auscultation bilaterally Abd:  No distention.  Soft and nontender Other:  No lower extremity edema or calf tenderness.  Symmetric calf circumference.   ED Results / Procedures / Treatments   Labs (all labs ordered are listed, but only abnormal results are displayed) Labs Reviewed  BASIC METABOLIC PANEL - Abnormal; Notable for the  following components:      Result Value   Glucose, Bld 105 (*)    All other components within normal limits  CBC - Abnormal; Notable for the following components:   WBC 11.7 (*)    RBC 5.12 (*)    RDW 15.8 (*)    All other components within normal limits  TROPONIN I (HIGH SENSITIVITY)  TROPONIN I (HIGH SENSITIVITY)     EKG Interpreted by me Normal sinus rhythm rate of 84.  Left axis, normal intervals.  Poor R wave progression.  Normal ST segments and T waves.  No ischemic changes.   RADIOLOGY Chest x-ray interpreted by me, appears normal.  Radiology report reviewed.  CT angiogram chest abdomen pelvis pending   PROCEDURES:  Procedures   MEDICATIONS ORDERED IN ED: Medications - No data to display   IMPRESSION / MDM / [place] / ED COURSE  I reviewed the triage vital signs and the nursing notes.  Differential diagnosis includes, but is not limited to, non-STEMI, angina, GERD, pneumothorax, aortic dissection, pneumonia, electrolyte abnormality, anemia.  Doubt PE  Patient's presentation is most consistent with acute presentation with potential threat to life or bodily function.  Patient presents with central chest pain described as pressure, radiating and concerning for underlying cardiac cause.  Initial EKG is nonischemic, troponin is normal, serum labs unremarkable.  Will obtain CT angiogram to rule out dissection.  Due to her escalating symptoms will need to hospitalize for telemetry monitoring and further cardiac work-up.  Case discussed with hospitalist       FINAL CLINICAL IMPRESSION(S) / ED DIAGNOSES   Final diagnoses:  Chest pain with moderate risk for cardiac etiology     Rx / DC Orders   ED Discharge Orders     None        Note:  This document was prepared using Dragon voice recognition software and may include unintentional dictation errors.   Carrie Mew, MD 09/03/22 [phone]

## 2022-09-03 NOTE — Assessment & Plan Note (Signed)
-   Zocor

## 2022-09-03 NOTE — H&P (Signed)
History and Physical    Emily Berry YBO:[id number] DOB: 03/10/1951 DOA: 09/03/2022  Referring MD/NP/PA:   PCP: Waylan Rocher, MD   Patient coming from:  The patient is coming from home.  At baseline, pt is independent for most of ADL.        Chief Complaint: chest pain  HPI: Emily Berry is a 71 y.o. female with medical history significant of angina pectoris, hypertension, hyperlipidemia, hypothyroidism, depression, cervical radiculitis, diverticulosis, who presents with chest pain.    Patient has history of angina pectoris, but states that her chest pain has been more frequent in the past 2 days.  She states that she has had 3 episode of chest pain each day. She she has pain from from jaw to neck and to front chest, radiating to upper back between shoulder blades.  The pain is pressure-like, severe, associated with mild shortness breath.  Patient has mild dry cough, no fever or chills.  Patient does not have nausea vomiting, diarrhea or abdominal pain.  No symptoms of UTI.  No recent long distance traveling.  Data reviewed independently and ED Course: pt was found to have troponin level 3, WBC 11.7, temperature normal, blood pressure 150/80, heart rate 93, RR18, oxygen saturation 93-98% on room air.  Chest x-ray negative for infiltration, but showed possible right middle lobe lung nodule.  CTA negative for aortic dissection or central PE.  Patient is placed on telemetry bed for observation, Dr. Saralyn Pilar of cardiology is consulted.   EKG: I have personally reviewed.  Sinus rhythm, QTc 418, possible bilateral atrial enlargement, LAD, poor R wave progression.   Review of Systems:   General: no fevers, chills, no body weight gain, fatigue HEENT: no blurry vision, hearing changes or sore throat Respiratory: has dyspnea, coughing, no wheezing CV: has chest pain, no palpitations GI: no nausea, vomiting, abdominal pain, diarrhea, constipation GU: no dysuria, burning on  urination, increased urinary frequency, hematuria  Ext: no leg edema Neuro: no unilateral weakness, numbness, or tingling, no vision change or hearing loss Skin: no rash, no skin tear. MSK: No muscle spasm, no deformity, no limitation of range of movement in spin Heme: No easy bruising.  Travel history: No recent long distant travel.   Allergy:  Allergies  Allergen Reactions   Codeine Nausea Only    Past Medical History:  Diagnosis Date   Dysplastic nevus 07/25/2015   Right lower quadrant abdomen. Mild atypia, limited margins free.    Hypothyroidism    Thyroid disease     Past Surgical History:  Procedure Laterality Date   ABDOMINAL HYSTERECTOMY     BACK SURGERY     COLONOSCOPY WITH PROPOFOL N/A 05/16/2018   Procedure: COLONOSCOPY WITH PROPOFOL;  Surgeon: Virgel Manifold, MD;  Location: [facility] ENDOSCOPY;  Service: Endoscopy;  Laterality: N/A;    Social History:  reports that she has never smoked. She has never used smokeless tobacco. She reports that she does not drink alcohol and does not use drugs.  Family History:  Family History  Problem Relation Age of Onset   Cancer Mother 29       cervical   Breast cancer Mother 44   Cancer Brother        sinus, bone ca   Breast cancer Maternal Aunt    Breast cancer Cousin    Breast cancer Maternal Aunt      Prior to Admission medications   Medication Sig Start Date End Date Taking? Authorizing Provider  baclofen (LIORESAL)  10 MG tablet Take 10 mg by mouth 2 (two) times daily as needed for muscle spasms.   Yes [provider]  Calcium Carb-Cholecalciferol 600-10 MG-MCG TABS Take 1 tablet by mouth 2 (two) times daily.   Yes [provider]  DULoxetine (CYMBALTA) 60 MG capsule Take 60 mg by mouth daily.   Yes [provider]  levothyroxine (SYNTHROID, LEVOTHROID) 50 MCG tablet Take 50 mcg by mouth daily before breakfast.   Yes [provider]  losartan-hydrochlorothiazide (HYZAAR) 50-12.5  MG tablet Take 1 tablet by mouth daily.   Yes [provider]  nabumetone (RELAFEN) 500 MG tablet Take 500 mg by mouth 2 (two) times daily.   Yes [provider]  omeprazole (PRILOSEC) 20 MG capsule Take 20 mg by mouth at bedtime.   Yes [provider]  pregabalin (LYRICA) 150 MG capsule Take 150 mg by mouth 2 (two) times daily.   Yes [provider]  simvastatin (ZOCOR) 40 MG tablet Take 40 mg by mouth daily.   Yes [provider]    Physical Exam: Vitals:   09/03/22 1257 09/03/22 1431 09/03/22 1824  BP: 135/89 (!) 150/80 132/69  Pulse: 93 85 85  Resp: '18 18 18  '$ Temp: 98.5 F (36.9 C) 98.5 F (36.9 C) 98.3 F (36.8 C)  TempSrc: Oral Oral Oral  SpO2: 93% 98% 98%  Weight: 72.1 kg    Height: '4\' 11"'$  (1.499 m)     General: Not in acute distress HEENT:       Eyes: PERRL, EOMI, no scleral icterus.       ENT: No discharge from the ears and nose, no pharynx injection, no tonsillar enlargement.        Neck: No JVD, no bruit, no mass felt. Heme: No neck lymph node enlargement. Cardiac: S1/S2, RRR, No murmurs, No gallops or rubs. Respiratory: No rales, wheezing, rhonchi or rubs. GI: Soft, nondistended, nontender, no rebound pain, no organomegaly, BS present. GU: No hematuria Ext: No pitting leg edema bilaterally. 1+DP/PT pulse bilaterally. Musculoskeletal: No joint deformities, No joint redness or warmth, no limitation of ROM in spin. Skin: No rashes.  Neuro: Alert, oriented X3, cranial nerves II-XII grossly intact, moves all extremities normally.  Psych: Patient is not psychotic, no suicidal or hemocidal ideation.  Labs on Admission: I have personally reviewed following labs and imaging studies  CBC: Recent Labs  Lab 09/03/22 1259  WBC 11.7*  HGB 13.6  HCT 43.5  MCV 85.0  PLT 638   Basic Metabolic Panel: Recent Labs  Lab 09/03/22 1259  NA 138  K 3.8  CL 103  CO2 27  GLUCOSE 105*  BUN 17  CREATININE 0.92  CALCIUM 9.2    GFR: Estimated Creatinine Clearance: 47.8 mL/min (by C-G formula based on SCr of 0.92 mg/dL). Liver Function Tests: No results for input(s): "AST", "ALT", "ALKPHOS", "BILITOT", "PROT", "ALBUMIN" in the last 168 hours. No results for input(s): "LIPASE", "AMYLASE" in the last 168 hours. No results for input(s): "AMMONIA" in the last 168 hours. Coagulation Profile: No results for input(s): "INR", "PROTIME" in the last 168 hours. Cardiac Enzymes: No results for input(s): "CKTOTAL", "CKMB", "CKMBINDEX", "TROPONINI" in the last 168 hours. BNP (last 3 results) No results for input(s): "PROBNP" in the last 8760 hours. HbA1C: No results for input(s): "HGBA1C" in the last 72 hours. CBG: No results for input(s): "GLUCAP" in the last 168 hours. Lipid Profile: Recent Labs    09/03/22 1259  CHOL 188  HDL 42  LDLCALC 72  TRIG 370*  CHOLHDL 4.5   Thyroid Function Tests: No results for input(s): "TSH", "T4TOTAL", "FREET4", "T3FREE", "THYROIDAB" in the last 72 hours. Anemia Panel: No results for input(s): "VITAMINB12", "FOLATE", "FERRITIN", "TIBC", "IRON", "RETICCTPCT" in the last 72 hours. Urine analysis:    Component Value Date/Time   COLORURINE Yellow 12/23/2013 1910   APPEARANCEUR Clear 12/23/2013 1910   LABSPEC 1.012 12/23/2013 1910   PHURINE 6.0 12/23/2013 1910   GLUCOSEU Negative 12/23/2013 1910   HGBUR 1+ 12/23/2013 1910   BILIRUBINUR Negative 12/23/2013 1910   KETONESUR Negative 12/23/2013 1910   PROTEINUR Negative 12/23/2013 1910   NITRITE Negative 12/23/2013 1910   LEUKOCYTESUR Negative 12/23/2013 1910   Sepsis Labs: '@LABRCNTIP'$ (procalcitonin:4,lacticidven:4) )No results found for this or any previous visit (from the past 240 hour(s)).   Radiological Exams on Admission: CT Angio Chest/Abd/Pel for Dissection W and/or Wo Contrast  Result Date: 09/03/2022 CLINICAL DATA:  Chest pain, back pain EXAM: CT ANGIOGRAPHY CHEST, ABDOMEN AND PELVIS TECHNIQUE: Non-contrast CT of the  chest was initially obtained. Multidetector CT imaging through the chest, abdomen and pelvis was performed using the standard protocol during bolus administration of intravenous contrast. Multiplanar reconstructed images and MIPs were obtained and reviewed to evaluate the vascular anatomy. RADIATION DOSE REDUCTION: This exam was performed according to the departmental dose-optimization program which includes automated exposure control, adjustment of the mA and/or kV according to patient size and/or use of iterative reconstruction technique. CONTRAST:  139m OMNIPAQUE IOHEXOL 350 MG/ML SOLN COMPARISON:  Chest radiograph done today FINDINGS: CTA CHEST FINDINGS Cardiovascular: There is no demonstrable mural hematoma in the noncontrast images of the thoracic aorta. Coronary artery calcifications are seen. There is homogeneous enhancement in thoracic aorta. There are no intraluminal filling defects in pulmonary artery branches. Mediastinum/Nodes: No significant lymphadenopathy is seen. Lungs/Pleura: There is no focal pulmonary consolidation. There is no pleural effusion or pneumothorax. Musculoskeletal: No acute findings are seen in bony structures in thorax. Review of the MIP images confirms the above findings. CTA ABDOMEN AND PELVIS FINDINGS VASCULAR Aorta: There are scattered calcifications and plaque seen abdominal aorta without dissection or focal aneurysmal dilation. Celiac: There is no significant stenosis. SMA: There is no significant stenosis. Renals: There are coarse calcifications in the proximal courses of renal arteries without significant stenosis. IMA: Patent. Inflow: Unremarkable. Veins: Unremarkable. Review of the MIP images confirms the above findings. NON-VASCULAR Hepatobiliary: There is fatty infiltration. Surgical clips are seen in gallbladder fossa. Pancreas: No focal abnormalities are seen. Spleen: Inhomogeneous enhancement most likely is due to the arterial phase of the imaging sequence.  Adrenals/Urinary Tract: There is mild hyperplasia of left adrenal. There is no hydronephrosis. There are no renal or ureteral stones. Urinary bladder is distended. Stomach/Bowel: Small hiatal hernia is seen. Small bowel loops are not dilated. Appendix is not seen. There is no significant wall thickening and colon. Scattered diverticula are seen without signs of focal acute diverticulitis. Lymphatic: Unremarkable. Reproductive: Uterus is not seen. Other: There is no ascites or pneumoperitoneum. Small umbilical hernia containing fat is seen. Musculoskeletal: There is fusion of bodies of L2 and L3 vertebrae. No acute findings are seen in bony structures. Review of the MIP images confirms the above findings. IMPRESSION: There is no evidence of dissection in thoracic and abdominal aorta. Major branches of thoracic and abdominal aorta are patent. Coronary artery calcifications are seen. There is no evidence of pulmonary artery embolism. There is no focal pulmonary consolidation. There is no evidence of intestinal obstruction or pneumoperitoneum. There  is no hydronephrosis. Fatty liver. Diverticulosis of colon without signs of diverticulitis. Other findings as described in the body of the report. Electronically Signed   By: Elmer Picker M.D.   On: 09/03/2022 18:06   DG Chest 2 View  Result Date: 09/03/2022 CLINICAL DATA:  Chest pain. EXAM: CHEST - 2 VIEW COMPARISON:  None Available. FINDINGS: The heart size and mediastinal contours are within normal limits. Possible pulmonary nodule in the right midlung. Both lungs are otherwise clear. No visible pleural effusions or pneumothorax. No acute osseous abnormality. Cholecystectomy clips. IMPRESSION: 1. Possible pulmonary nodule in the right midlung. Recommend chest CT to further evaluate. 2. Multiple no evidence of acute cardiopulmonary disease. Electronically Signed   By: Margaretha Sheffield M.D.   On: 09/03/2022 13:20      Assessment/Plan Principal Problem:    Chest pain Active Problems:   HTN (hypertension)   HLD (hyperlipidemia)   Hypothyroidism   Depression   Cervical radiculitis   Lung nodule   Assessment and Plan: * Chest pain CAT negative for dissection or central PE.  Initial troponin 3.  Consulted Dr. Saralyn Pilar of cardiology  - place to tele bed for observation - Trend Trop - Repeat EKG in the am  - prn Nitroglycerin, Morphine, and aspirin, Zocor - Risk factor stratification: will check FLP and A1C  - check UDS    HTN (hypertension) - IV labetalol as needed -Hyzarr  HLD (hyperlipidemia) - Zocor  Hypothyroidism - Synthroid  Depression - Continue home medications  Cervical radiculitis - As needed Tylenol, baclofen - Lyrica  Lung nodule Chest x-ray showed a possible right middle lobe nodule. -Follow-up with PCP          DVT ppx: SCD  Code Status: Full code  Family Communication:   Disposition Plan: I offered to call her family, but the patient states that she will call by herself.  Consults called:   Dr. Saralyn Pilar of cardiology is consulted.  Admission status and Level of care: Telemetry Cardiac:   for obs     Dispo: The patient is from: Home              Anticipated d/c is to: Home              Anticipated d/c date is: 1 day              Patient currently is not medically stable to d/c.    Severity of Illness:  The appropriate patient status for this patient is INPATIENT. Inpatient status is judged to be reasonable and necessary in order to provide the required intensity of service to ensure the patient's safety. The patient's presenting symptoms, physical exam findings, and initial radiographic and laboratory data in the context of their chronic comorbidities is felt to place them at high risk for further clinical deterioration. Furthermore, it is not anticipated that the patient will be medically stable for discharge from the hospital within 2 midnights of admission.   * I certify that  at the point of admission it is my clinical judgment that the patient will require inpatient hospital care spanning beyond 2 midnights from the point of admission due to high intensity of service, high risk for further deterioration and high frequency of surveillance required.*       Date of Service 09/03/2022    Ivor Costa [facility]   If 7PM-7AM, please contact night-coverage [email or web address] 09/03/2022, 7:07 PM

## 2022-09-03 NOTE — Assessment & Plan Note (Signed)
-   IV labetalol as needed -Hyzarr

## 2022-09-03 NOTE — ED Notes (Signed)
Report received and care assumed.

## 2022-09-03 NOTE — Assessment & Plan Note (Signed)
Synthroid 

## 2022-09-03 NOTE — ED Triage Notes (Signed)
Saturday chest, jaw and neck pain

## 2022-09-03 NOTE — Assessment & Plan Note (Addendum)
-   As needed Tylenol, baclofen - Lyrica

## 2022-09-03 NOTE — ED Notes (Signed)
Spoke with RN who will be receiving pt.  Room is clean at this time.  Pt to be taken to room 240 via stretcher.  Pt denies pain or discomfort at this time.  Pain 0/10

## 2022-09-03 NOTE — Assessment & Plan Note (Signed)
Chest x-ray showed a possible right middle lobe nodule. -Follow-up with PCP

## 2022-09-04 ENCOUNTER — Encounter: Payer: Self-pay | Admitting: Internal Medicine

## 2022-09-04 DIAGNOSIS — R0789 Other chest pain: Secondary | ICD-10-CM | POA: Diagnosis not present

## 2022-09-04 DIAGNOSIS — R079 Chest pain, unspecified: Secondary | ICD-10-CM | POA: Diagnosis not present

## 2022-09-04 LAB — CBC
HCT: 39.1 % (ref 36.0–46.0)
Hemoglobin: 12.5 g/dL (ref 12.0–15.0)
MCH: 26.5 pg (ref 26.0–34.0)
MCHC: 32 g/dL (ref 30.0–36.0)
MCV: 83 fL (ref 80.0–100.0)
Platelets: 268 10*3/uL (ref 150–400)
RBC: 4.71 MIL/uL (ref 3.87–5.11)
RDW: 15.6 % — ABNORMAL HIGH (ref 11.5–15.5)
WBC: 10.8 10*3/uL — ABNORMAL HIGH (ref 4.0–10.5)
nRBC: 0 % (ref 0.0–0.2)

## 2022-09-04 LAB — BASIC METABOLIC PANEL
Anion gap: 8 (ref 5–15)
BUN: 19 mg/dL (ref 8–23)
CO2: 27 mmol/L (ref 22–32)
Calcium: 9.1 mg/dL (ref 8.9–10.3)
Chloride: 103 mmol/L (ref 98–111)
Creatinine, Ser: 0.85 mg/dL (ref 0.44–1.00)
GFR, Estimated: >60 mL/min (ref >60)
Glucose, Bld: 121 mg/dL — ABNORMAL HIGH (ref 70–99)
Potassium: 3.4 mmol/L — ABNORMAL LOW (ref 3.5–5.1)
Sodium: 138 mmol/L (ref 135–145)

## 2022-09-04 LAB — TROPONIN I (HIGH SENSITIVITY)
Troponin I (High Sensitivity): 5 ng/L (ref <18)
Troponin I (High Sensitivity): 5 ng/L (ref <18)

## 2022-09-04 LAB — GLUCOSE, CAPILLARY: Glucose-Capillary: 108 mg/dL — ABNORMAL HIGH (ref 70–99)

## 2022-09-04 MED ORDER — ASPIRIN 81 MG PO TBEC: 81.0000 mg | DELAYED_RELEASE_TABLET | Freq: Every day | ORAL | 12 refills | Status: DC

## 2022-09-04 NOTE — Consult Note (Signed)
[place] CARDIOLOGY CONSULT NOTE       Patient ID: Emily Berry MRN: [id number] DOB/AGE: March 03, 1951 71 y.o.  Admit date: 09/03/2022 Referring Physician Dr. Blaine Hamper Primary Physician  Primary Cardiologist Dr. Nehemiah Massed (seen once in 2018)  Reason for Consultation chest pain   HPI: Emily Berry is a 58yoF with a PMH of hypertension, hyperlipidemia, hypothyroidism, chronic low back pain with history of L-spine surgery (1990s), osteoarthritis, fibromyalgia who presented to [facility] ED 09/03/2022 with chest pressure ongoing for several months but worsening over the past 2 days for which cardiology is consulted for further assistance.  The patient states she has had pain initially it starts in her jaw that goes down to her neck and into the center of her chest and into her back.  She has had several episodes of this over the past couple months. One instance occurred the first week of August when she was sitting and waiting for a B12 injection and the discomfort was relieved with tensing of the muscles of her upper arms and chest.  She had another episode of this discomfort this past Friday that was self resolving after 5 minutes or so.  Notably, she was able to mow the lawn with her push mower yesterday without this discomfort.  She denies any associated nausea, or diaphoresis but does note she sweats rather easily.  She denies orthopnea, peripheral edema, palpitations.   she was seen once by Dr. Nehemiah Massed in 2018 for similar reasons and had a normal stress echo.  She is a non-smoker, nondrinker, does have a family history of heart failure in her mother, 2 brothers that have had bypass, a sister with a stent.  Vitals notable for blood pressure of 138/69, heart rate 74 in sinus rhythm on telemetry, SPO2 96% on room air.  Labs are notable for a potassium of 3.4, BUN/creatinine 19/0.85, GFR greater than 60.  H&H stable at 12.5/39.1.  High-sensitivity troponin negative at 3.  CT angio chest abdomen  pelvis is negative for abdominal or thoracic aortic dissection or pulmonary embolism.   Review of systems complete and found to be negative unless listed above     Past Medical History:  Diagnosis Date   Dysplastic nevus 07/25/2015   Right lower quadrant abdomen. Mild atypia, limited margins free.    Hypothyroidism    Thyroid disease     Past Surgical History:  Procedure Laterality Date   ABDOMINAL HYSTERECTOMY     BACK SURGERY     COLONOSCOPY WITH PROPOFOL N/A 05/16/2018   Procedure: COLONOSCOPY WITH PROPOFOL;  Surgeon: Virgel Manifold, MD;  Location: [facility] ENDOSCOPY;  Service: Endoscopy;  Laterality: N/A;    Medications Prior to Admission  Medication Sig Dispense Refill Last Dose   baclofen (LIORESAL) 10 MG tablet Take 10 mg by mouth 2 (two) times daily as needed for muscle spasms.   Unknown at PRN   Calcium Carb-Cholecalciferol 600-10 MG-MCG TABS Take 1 tablet by mouth 2 (two) times daily.      DULoxetine (CYMBALTA) 60 MG capsule Take 60 mg by mouth daily.   09/02/2022 at Unknown   levothyroxine (SYNTHROID, LEVOTHROID) 50 MCG tablet Take 50 mcg by mouth daily before breakfast.   09/03/2022 at 0700   losartan-hydrochlorothiazide (HYZAAR) 50-12.5 MG tablet Take 1 tablet by mouth daily.   09/03/2022 at 0800   nabumetone (RELAFEN) 500 MG tablet Take 500 mg by mouth 2 (two) times daily.   09/03/2022 at 0800   omeprazole (PRILOSEC) 20 MG capsule Take 20  mg by mouth at bedtime.   09/02/2022 at 2000   pregabalin (LYRICA) 150 MG capsule Take 150 mg by mouth 2 (two) times daily.   09/03/2022 at 0800   simvastatin (ZOCOR) 40 MG tablet Take 40 mg by mouth daily.   09/02/2022 at Unknown   Social History   Socioeconomic History   Marital status: Divorced    Spouse name: Not on file   Number of children: Not on file   Years of education: Not on file   Highest education level: Not on file  Occupational History   Not on file  Tobacco Use   Smoking status: Never   Smokeless tobacco: Never   Vaping Use   Vaping Use: Never used  Substance and Sexual Activity   Alcohol use: No   Drug use: No   Sexual activity: Not on file  Other Topics Concern   Not on file  Social History Narrative   Not on file   Social Determinants of Health   Financial Resource Strain: Not on file  Food Insecurity: No Food Insecurity (09/04/2022)   Hunger Vital Sign    Worried About Running Out of Food in the Last Year: Never true    Ran Out of Food in the Last Year: Never true  Transportation Needs: No Transportation Needs (09/04/2022)   PRAPARE - Hydrologist (Medical): No    Lack of Transportation (Non-Medical): No  Physical Activity: Not on file  Stress: Not on file  Social Connections: Not on file  Intimate Partner Violence: Not on file    Family History  Problem Relation Age of Onset   Cancer Mother 43       cervical   Breast cancer Mother 84   Cancer Brother        sinus, bone ca   Breast cancer Maternal Aunt    Breast cancer Cousin    Breast cancer Maternal Aunt       PHYSICAL EXAM General: Pleasant elderly caucasian female , well nourished, in no acute distress. HEENT:  Normocephalic and atraumatic. Neck:  No JVD.  Lungs: Normal respiratory effort on room air. Clear bilaterally to auscultation. No wheezes, crackles, rhonchi.  Heart: HRRR . Normal S1 and S2 without gallops or murmurs.  Abdomen: Non-distended appearing.  Msk: Normal strength and tone for age. Extremities: Warm and well perfused. No clubbing, cyanosis.  No peripheral edema.  Neuro: Alert and oriented X 3. Psych:  Answers questions appropriately.   Labs: Basic Metabolic Panel: Recent Labs    09/03/22 1259 09/04/22 0607  NA 138 138  K 3.8 3.4*  CL 103 103  CO2 27 27  GLUCOSE 105* 121*  BUN 17 19  CREATININE 0.92 0.85  CALCIUM 9.2 9.1   Liver Function Tests: No results for input(s): "AST", "ALT", "ALKPHOS", "BILITOT", "PROT", "ALBUMIN" in the last 72 hours. No results  for input(s): "LIPASE", "AMYLASE" in the last 72 hours. CBC: Recent Labs    09/03/22 1259 09/04/22 0607  WBC 11.7* 10.8*  HGB 13.6 12.5  HCT 43.5 39.1  MCV 85.0 83.0  PLT 301 268   Cardiac Enzymes: Recent Labs    09/03/22 1259  TROPONINIHS 3   BNP: Invalid input(s): "POCBNP" D-Dimer: No results for input(s): "DDIMER" in the last 72 hours. Hemoglobin A1C: Recent Labs    09/03/22 1259  HGBA1C 5.8*   Fasting Lipid Panel: Recent Labs    09/03/22 1259  CHOL 188  HDL 42  LDLCALC 72  TRIG 370*  CHOLHDL 4.5   Thyroid Function Tests: No results for input(s): "TSH", "T4TOTAL", "T3FREE", "THYROIDAB" in the last 72 hours.  Invalid input(s): "FREET3" Anemia Panel: No results for input(s): "VITAMINB12", "FOLATE", "FERRITIN", "TIBC", "IRON", "RETICCTPCT" in the last 72 hours.  CT Angio Chest/Abd/Pel for Dissection W and/or Wo Contrast  Result Date: 09/03/2022 CLINICAL DATA:  Chest pain, back pain EXAM: CT ANGIOGRAPHY CHEST, ABDOMEN AND PELVIS TECHNIQUE: Non-contrast CT of the chest was initially obtained. Multidetector CT imaging through the chest, abdomen and pelvis was performed using the standard protocol during bolus administration of intravenous contrast. Multiplanar reconstructed images and MIPs were obtained and reviewed to evaluate the vascular anatomy. RADIATION DOSE REDUCTION: This exam was performed according to the departmental dose-optimization program which includes automated exposure control, adjustment of the mA and/or kV according to patient size and/or use of iterative reconstruction technique. CONTRAST:  155m OMNIPAQUE IOHEXOL 350 MG/ML SOLN COMPARISON:  Chest radiograph done today FINDINGS: CTA CHEST FINDINGS Cardiovascular: There is no demonstrable mural hematoma in the noncontrast images of the thoracic aorta. Coronary artery calcifications are seen. There is homogeneous enhancement in thoracic aorta. There are no intraluminal filling defects in pulmonary artery  branches. Mediastinum/Nodes: No significant lymphadenopathy is seen. Lungs/Pleura: There is no focal pulmonary consolidation. There is no pleural effusion or pneumothorax. Musculoskeletal: No acute findings are seen in bony structures in thorax. Review of the MIP images confirms the above findings. CTA ABDOMEN AND PELVIS FINDINGS VASCULAR Aorta: There are scattered calcifications and plaque seen abdominal aorta without dissection or focal aneurysmal dilation. Celiac: There is no significant stenosis. SMA: There is no significant stenosis. Renals: There are coarse calcifications in the proximal courses of renal arteries without significant stenosis. IMA: Patent. Inflow: Unremarkable. Veins: Unremarkable. Review of the MIP images confirms the above findings. NON-VASCULAR Hepatobiliary: There is fatty infiltration. Surgical clips are seen in gallbladder fossa. Pancreas: No focal abnormalities are seen. Spleen: Inhomogeneous enhancement most likely is due to the arterial phase of the imaging sequence. Adrenals/Urinary Tract: There is mild hyperplasia of left adrenal. There is no hydronephrosis. There are no renal or ureteral stones. Urinary bladder is distended. Stomach/Bowel: Small hiatal hernia is seen. Small bowel loops are not dilated. Appendix is not seen. There is no significant wall thickening and colon. Scattered diverticula are seen without signs of focal acute diverticulitis. Lymphatic: Unremarkable. Reproductive: Uterus is not seen. Other: There is no ascites or pneumoperitoneum. Small umbilical hernia containing fat is seen. Musculoskeletal: There is fusion of bodies of L2 and L3 vertebrae. No acute findings are seen in bony structures. Review of the MIP images confirms the above findings. IMPRESSION: There is no evidence of dissection in thoracic and abdominal aorta. Major branches of thoracic and abdominal aorta are patent. Coronary artery calcifications are seen. There is no evidence of pulmonary artery  embolism. There is no focal pulmonary consolidation. There is no evidence of intestinal obstruction or pneumoperitoneum. There is no hydronephrosis. Fatty liver. Diverticulosis of colon without signs of diverticulitis. Other findings as described in the body of the report. Electronically Signed   By: PElmer PickerM.D.   On: 09/03/2022 18:06   DG Chest 2 View  Result Date: 09/03/2022 CLINICAL DATA:  Chest pain. EXAM: CHEST - 2 VIEW COMPARISON:  None Available. FINDINGS: The heart size and mediastinal contours are within normal limits. Possible pulmonary nodule in the right midlung. Both lungs are otherwise clear. No visible pleural effusions or pneumothorax. No acute osseous abnormality. Cholecystectomy clips. IMPRESSION: 1. Possible  pulmonary nodule in the right midlung. Recommend chest CT to further evaluate. 2. Multiple no evidence of acute cardiopulmonary disease. Electronically Signed   By: Margaretha Sheffield M.D.   On: 09/03/2022 13:20     Radiology: CT Angio Chest/Abd/Pel for Dissection W and/or Wo Contrast  Result Date: 09/03/2022 CLINICAL DATA:  Chest pain, back pain EXAM: CT ANGIOGRAPHY CHEST, ABDOMEN AND PELVIS TECHNIQUE: Non-contrast CT of the chest was initially obtained. Multidetector CT imaging through the chest, abdomen and pelvis was performed using the standard protocol during bolus administration of intravenous contrast. Multiplanar reconstructed images and MIPs were obtained and reviewed to evaluate the vascular anatomy. RADIATION DOSE REDUCTION: This exam was performed according to the departmental dose-optimization program which includes automated exposure control, adjustment of the mA and/or kV according to patient size and/or use of iterative reconstruction technique. CONTRAST:  190m OMNIPAQUE IOHEXOL 350 MG/ML SOLN COMPARISON:  Chest radiograph done today FINDINGS: CTA CHEST FINDINGS Cardiovascular: There is no demonstrable mural hematoma in the noncontrast images of the  thoracic aorta. Coronary artery calcifications are seen. There is homogeneous enhancement in thoracic aorta. There are no intraluminal filling defects in pulmonary artery branches. Mediastinum/Nodes: No significant lymphadenopathy is seen. Lungs/Pleura: There is no focal pulmonary consolidation. There is no pleural effusion or pneumothorax. Musculoskeletal: No acute findings are seen in bony structures in thorax. Review of the MIP images confirms the above findings. CTA ABDOMEN AND PELVIS FINDINGS VASCULAR Aorta: There are scattered calcifications and plaque seen abdominal aorta without dissection or focal aneurysmal dilation. Celiac: There is no significant stenosis. SMA: There is no significant stenosis. Renals: There are coarse calcifications in the proximal courses of renal arteries without significant stenosis. IMA: Patent. Inflow: Unremarkable. Veins: Unremarkable. Review of the MIP images confirms the above findings. NON-VASCULAR Hepatobiliary: There is fatty infiltration. Surgical clips are seen in gallbladder fossa. Pancreas: No focal abnormalities are seen. Spleen: Inhomogeneous enhancement most likely is due to the arterial phase of the imaging sequence. Adrenals/Urinary Tract: There is mild hyperplasia of left adrenal. There is no hydronephrosis. There are no renal or ureteral stones. Urinary bladder is distended. Stomach/Bowel: Small hiatal hernia is seen. Small bowel loops are not dilated. Appendix is not seen. There is no significant wall thickening and colon. Scattered diverticula are seen without signs of focal acute diverticulitis. Lymphatic: Unremarkable. Reproductive: Uterus is not seen. Other: There is no ascites or pneumoperitoneum. Small umbilical hernia containing fat is seen. Musculoskeletal: There is fusion of bodies of L2 and L3 vertebrae. No acute findings are seen in bony structures. Review of the MIP images confirms the above findings. IMPRESSION: There is no evidence of dissection in  thoracic and abdominal aorta. Major branches of thoracic and abdominal aorta are patent. Coronary artery calcifications are seen. There is no evidence of pulmonary artery embolism. There is no focal pulmonary consolidation. There is no evidence of intestinal obstruction or pneumoperitoneum. There is no hydronephrosis. Fatty liver. Diverticulosis of colon without signs of diverticulitis. Other findings as described in the body of the report. Electronically Signed   By: PElmer PickerM.D.   On: 09/03/2022 18:06   DG Chest 2 View  Result Date: 09/03/2022 CLINICAL DATA:  Chest pain. EXAM: CHEST - 2 VIEW COMPARISON:  None Available. FINDINGS: The heart size and mediastinal contours are within normal limits. Possible pulmonary nodule in the right midlung. Both lungs are otherwise clear. No visible pleural effusions or pneumothorax. No acute osseous abnormality. Cholecystectomy clips. IMPRESSION: 1. Possible pulmonary nodule in the right midlung.  Recommend chest CT to further evaluate. 2. Multiple no evidence of acute cardiopulmonary disease. Electronically Signed   By: Margaretha Sheffield M.D.   On: 09/03/2022 13:20    ECHO 07/03/2017                    CARDIOLOGY DEPARTMENT                   [place], [place]                      [id number]                    [place] #: [id number]          [address] [place], [place] [address]       Date: 07/03/2017 03:06 PM                                                             Adult Female Age: 73 yrs                     ECHOCARDIOGRAM REPORT                   Outpatient                                                             KC::KCWC            STUDY:Stress Echo        TAPE:                   MD1:  Flossie Dibble             ECHO:Yes   DOPPLER:Yes  FILE:0000-000-000       BP: 113/74 mmHg            COLOR:Yes  CONTRAST:No      MACHINE:Philips      Height: 59 in        RV  BIOPSY:No         3D:No   SOUND QLTY:Moderate     Weight: 155 lb           MEDIUM:None                                       BSA: 1.7 m2   ___________________________________________________________________________________________              HISTORY:Chest pain               REASON:Assess, LV function           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  ___________________________________________________________________________________________   STRESS ECHOCARDIOGRAPHY  Protocol:Treadmill (Bruce)              Drugs:None  Target Heart Rate:130 bpm        Maximum Predicted Heart Rate: 153 bpm   +-------------------+-------------------------+-------------------------+------------+         Stage            Duration (mm:ss)         Heart Rate (bpm)         BP       +-------------------+-------------------------+-------------------------+------------+        RESTING                                           83              113/74     +-------------------+-------------------------+-------------------------+------------+        EXERCISE                6:00                     164                /        +-------------------+-------------------------+-------------------------+------------+        RECOVERY                7:10                      96              150/81     +-------------------+-------------------------+-------------------------+------------+     Stress Duration:6:00 mm:ss    Max Stress H.R.:164 bpm        Target Heart Rate Achieved: Yes   ___________________________________________________________________________________________   WALL SEGMENT CHANGES                         Rest          Stress  Anterior Septum Basal:Normal        Hyperkinetic                    ZTI:WPYKDX        Hyperkinetic                 Apical:Normal        Hyperkinetic     Anterior Wall Basal:Normal        Hyperkinetic                    IPJ:ASNKNL         Hyperkinetic                 Apical:Normal        Hyperkinetic      Lateral Wall Basal:Normal        Hyperkinetic                    ZJQ:BHALPF        Hyperkinetic                 Apical:Normal        Hyperkinetic    Posterior Wall Basal:Normal        Hyperkinetic                    XTK:WIOXBD  Hyperkinetic     Inferior Wall Basal:Normal        Hyperkinetic                    OZD:GUYQIH        Hyperkinetic                 Apical:Normal        Hyperkinetic   Inferior Septum Basal:Normal        Hyperkinetic                    KVQ:QVZDGL        Hyperkinetic              Resting EF:>55% (Est.)   Stress EF: >55% (Est.)   ___________________________________________________________________________________________   ADDITIONAL FINDINGS    ___________________________________________________________________________________________   STRESS ECG RESULTS                                                ECG Results:Normal   ___________________________________________________________________________________________  ECHOCARDIOGRAPHIC DESCRIPTIONS   LEFT VENTRICLE         Size:Normal  Contraction:Normal    LV Masses:No Masses          OVF:IEPP   RIGHT VENTRICLE         Size:Normal                Free Wall:Normal  Contraction:Normal                RV Masses:No mass   PERICARDIUM        Fluid:No effusion    _______________________________________________________________________________________  DOPPLER ECHO and OTHER SPECIAL PROCEDURES     Aortic:No AR                      No AS      Mitral:TRIVIAL MR                 No MS            MV Inflow E Vel=nm*        MV Annulus E'Vel=nm*            E/E'Ratio=nm*   Tricuspid:TRIVIAL TR                 No TS            198.0 cm/sec peak TR vel   20.7 mmHg peak RV pressure   Pulmonary:No PR                      No PS     ___________________________________________________________________________________________   ECHOCARDIOGRAPHIC MEASUREMENTS  2D DIMENSIONS  AORTA             Values      Normal Range      MAIN PA          Values      Normal Range            Annulus:  nm*       [2.1 - 2.5]                PA Main:  nm*       [1.5 - 2.1]          Aorta Sin:  nm*       [2.7 -  3.3]       RIGHT VENTRICLE        ST Junction:  nm*       [2.3 - 2.9]                RV Base:  nm*       [ < 4.2]          Asc.Aorta:  nm*       [2.3 - 3.1]                 RV Mid:  nm*       [ < 3.5]   LEFT VENTRICLE                                        RV Length:  nm*       [ < 8.6]              LVIDd:  nm*       [3.9 - 5.3]       INFERIOR VENA CAVA              LVIDs:  nm*                                 Max. IVC:  nm*       [ <= 2.1]                 FS:  nm*       [> 25]                    Min. IVC:  nm*                SWT:  nm*       [0.5 - 0.9]                   ------------------                PWT:  nm*       [0.5 - 0.9]                   nm* - not measured   LEFT ATRIUM            LA Diam:  nm*       [2.7 - 3.8]        LA A4C Area:  nm*       [ < 20]          LA Volume:  nm*       [22 - 52]    ___________________________________________________________________________________________  INTERPRETATION  Normal Stress Echocardiogram  NORMAL RIGHT VENTRICULAR SYSTOLIC FUNCTION  TRIVIAL REGURGITATION NOTED (See above)  NO VALVULAR STENOSIS NOTED    ___________________________________________________________________________________________  Electronically signed by: MD Serafina Royals on 07/07/2017 07:21 PM              Performed By: Johnathan Hausen, RDCS, RVT        Ordering Physician: Serafina Royals   ___________________________________________________________________________________________ Other Result Text  Interface, Text Results In - 07/07/2017  7:21 PM EDT                     CARDIOLOGY DEPARTMENT  JENINA, MOENING                        [facility]                      [id number]                     [place] #: [id number]          [address] [place], [place] [address]       Date: 07/03/2017 03:06 PM                                                             Adult Female Age: 22 yrs                     ECHOCARDIOGRAM REPORT                   Outpatient                                                             KC::KCWC            STUDY:Stress Echo        TAPE:                   MD1:  Flossie Dibble             ECHO:Yes   DOPPLER:Yes  FILE:0000-000-000       BP: 113/74 mmHg            COLOR:Yes  CONTRAST:No      MACHINE:Philips      Height: 59 in        RV BIOPSY:No         3D:No   SOUND QLTY:Moderate     Weight: 155 lb           MEDIUM:None                                       BSA: 1.7 m2   ___________________________________________________________________________________________              HISTORY:Chest pain               REASON:Assess, LV function           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  ___________________________________________________________________________________________   STRESS ECHOCARDIOGRAPHY            Protocol:Treadmill (Bruce)              Drugs:None  Target Heart Rate:130 bpm        Maximum Predicted Heart Rate: 153 bpm   +-------------------+-------------------------+-------------------------+------------+         Stage            Duration (mm:ss)  Heart Rate (bpm)         BP       +-------------------+-------------------------+-------------------------+------------+        RESTING                                           83              113/74     +-------------------+-------------------------+-------------------------+------------+        EXERCISE                6:00                     164                /        +-------------------+-------------------------+-------------------------+------------+        RECOVERY                7:10                       96              150/81     +-------------------+-------------------------+-------------------------+------------+     Stress Duration:6:00 mm:ss    Max Stress H.R.:164 bpm        Target Heart Rate Achieved: Yes   ___________________________________________________________________________________________   WALL SEGMENT CHANGES                         Rest          Stress  Anterior Septum Basal:Normal        Hyperkinetic                    RXV:QMGQQP        Hyperkinetic                 Apical:Normal        Hyperkinetic     Anterior Wall Basal:Normal        Hyperkinetic                    YPP:JKDTOI        Hyperkinetic                 Apical:Normal        Hyperkinetic      Lateral Wall Basal:Normal        Hyperkinetic                    ZTI:WPYKDX        Hyperkinetic                 Apical:Normal        Hyperkinetic    Posterior Wall Basal:Normal        Hyperkinetic                    IPJ:ASNKNL        Hyperkinetic     Inferior Wall Basal:Normal        Hyperkinetic                    ZJQ:BHALPF        Hyperkinetic                 Apical:Normal  Hyperkinetic   Inferior Septum Basal:Normal        Hyperkinetic                    JJH:ERDEYC        Hyperkinetic              Resting EF:>55% (Est.)   Stress EF: >55% (Est.)   ___________________________________________________________________________________________   ADDITIONAL FINDINGS    ___________________________________________________________________________________________   STRESS ECG RESULTS                                                ECG Results:Normal   ___________________________________________________________________________________________  ECHOCARDIOGRAPHIC DESCRIPTIONS   LEFT VENTRICLE         Size:Normal  Contraction:Normal    LV Masses:No Masses          XKG:YJEH   RIGHT VENTRICLE         Size:Normal                Free Wall:Normal  Contraction:Normal                RV Masses:No mass    PERICARDIUM        Fluid:No effusion    _______________________________________________________________________________________  DOPPLER ECHO and OTHER SPECIAL PROCEDURES     Aortic:No AR                      No AS      Mitral:TRIVIAL MR                 No MS            MV Inflow E Vel=nm*        MV Annulus E'Vel=nm*            E/E'Ratio=nm*   Tricuspid:TRIVIAL TR                 No TS            198.0 cm/sec peak TR vel   20.7 mmHg peak RV pressure   Pulmonary:No PR                      No PS     ___________________________________________________________________________________________  ECHOCARDIOGRAPHIC MEASUREMENTS  2D DIMENSIONS  AORTA             Values      Normal Range      MAIN PA          Values      Normal Range            Annulus:  nm*       [2.1 - 2.5]                PA Main:  nm*       [1.5 - 2.1]          Aorta Sin:  nm*       [2.7 - 3.3]       RIGHT VENTRICLE        ST Junction:  nm*       [2.3 - 2.9]                RV Base:  nm*       [ < 4.2]          Asc.Aorta:  nm*       [2.3 - 3.1]                 RV Mid:  nm*       [ < 3.5]   LEFT VENTRICLE                                        RV Length:  nm*       [ < 8.6]              LVIDd:  nm*       [3.9 - 5.3]       INFERIOR VENA CAVA              LVIDs:  nm*                                 Max. IVC:  nm*       [ <= 2.1]                 FS:  nm*       [> 25]                    Min. IVC:  nm*                SWT:  nm*       [0.5 - 0.9]                   ------------------                PWT:  nm*       [0.5 - 0.9]                   nm* - not measured   LEFT ATRIUM            LA Diam:  nm*       [2.7 - 3.8]        LA A4C Area:  nm*       [ < 20]          LA Volume:  nm*       [22 - 52]    ___________________________________________________________________________________________  INTERPRETATION  Normal Stress Echocardiogram  NORMAL RIGHT VENTRICULAR SYSTOLIC FUNCTION  TRIVIAL REGURGITATION NOTED (See above)   NO VALVULAR STENOSIS NOTED   TELEMETRY reviewed by me (LT) 09/04/2022 : NSR 70s  EKG reviewed by me: NSR lateral T wave flattening unchanged from prior in 2015  Data reviewed by me (LT) 09/04/2022: ED note, admission H&P, CBC, BMP, troponin, CTA chest, chest xray, prior cardiology note, prior stress echo   ASSESSMENT AND PLAN:  Emily Berry is a 68yoF with a PMH of hypertension, hyperlipidemia, hypothyroidism, chronic low back pain with history of L-spine surgery (1990s), osteoarthritis, fibromyalgia who presented to [facility] ED 09/03/2022 with chest pressure ongoing for several months but worsening over the past 2 days for which cardiology is consulted for further assistance.  #atypical chest pain  She presents with several episodes of jaw discomfort that radiates down her neck and into her chest that she describes as a "pressure" that is relieved with tensing the muscles of her upper arms and chest, and is nonexertional in nature.  Troponin is negative and EKG is essentially unchanged from 201%. -  S/p 325 mg aspirin, continue 81 mg aspirin daily -Continue home antihypertensives: Losartan 50-HCTZ 12.5 -Continue simvastatin 40 mg once daily -No further cardiac diagnostics necessary, okay for discharge from a cardiac standpoint with follow-up with Dr. Nehemiah Massed in 1 to 2 weeks.  This patient's plan of care was discussed and created with Dr. Saralyn Pilar and he is in agreement.  Signed: Tristan Schroeder , PA-C 09/04/2022, 7:48 AM [facility]

## 2022-09-04 NOTE — Discharge Summary (Signed)
Physician Discharge Summary   Patient: Emily Berry MRN: [id number] DOB: 1951/09/05  Admit date:     09/03/2022  Discharge date: 09/04/22  Discharge Physician: Fritzi Mandes   PCP: Waylan Rocher, MD   Recommendations at discharge:    F/u Dr Nehemiah Massed in 1-2 weeks  Discharge Diagnoses: Chest pain--atypical  Hospital Course: Emily Berry is a 71 y.o. female with medical history significant of angina pectoris, hypertension, hyperlipidemia, hypothyroidism, depression, cervical radiculitis, diverticulosis, who presents with chest pain.    Chest pain--appears atypical CT negative for dissection or central PE. Troponins flat -- Consulted Dr. Saralyn Pilar of cardiology--no further w/u indicated --pt is back to baseline -EKG --no ST-T changes  --ASA 81 mg qd --cont home meds   HTN (hypertension) - IV labetalol as needed -Hyzarr   HLD (hyperlipidemia) - Zocor   Hypothyroidism - Synthroid   Depression - Continue home medications   Cervical radiculitis - As needed Tylenol, baclofen - Lyrica   Lung nodule Chest x-ray showed a possible right middle lobe nodule. -Follow-up with PCP    overall stable hemodynamically      Consultants: [place] cards Procedures performed: none  Disposition: Home Diet recommendation:  Discharge Diet Orders (From admission, onward)     Start     Ordered   09/04/22 0000  Diet - low sodium heart healthy        09/04/22 1016           Cardiac diet DISCHARGE MEDICATION: Allergies as of 09/04/2022       Reactions   Codeine Nausea Only        Medication List     TAKE these medications    aspirin EC 81 MG tablet Take 1 tablet (81 mg total) by mouth daily. Swallow whole. Start taking on: September 05, 2022   baclofen 10 MG tablet Commonly known as: LIORESAL Take 10 mg by mouth 2 (two) times daily as needed for muscle spasms.   Calcium Carb-Cholecalciferol 600-10 MG-MCG Tabs Take 1 tablet by mouth 2 (two) times  daily.   DULoxetine 60 MG capsule Commonly known as: CYMBALTA Take 60 mg by mouth daily.   levothyroxine 50 MCG tablet Commonly known as: SYNTHROID Take 50 mcg by mouth daily before breakfast.   losartan-hydrochlorothiazide 50-12.5 MG tablet Commonly known as: HYZAAR Take 1 tablet by mouth daily.   nabumetone 500 MG tablet Commonly known as: RELAFEN Take 500 mg by mouth 2 (two) times daily.   omeprazole 20 MG capsule Commonly known as: PRILOSEC Take 20 mg by mouth at bedtime.   pregabalin 150 MG capsule Commonly known as: LYRICA Take 150 mg by mouth 2 (two) times daily.   simvastatin 40 MG tablet Commonly known as: ZOCOR Take 40 mg by mouth daily.        Follow-up Information     Corey Skains, MD. Go in 1 week(s).   Specialty: Cardiology Contact information: [facility] [place] [address] [phone]         Entzminger, Mendel Corning, MD. Schedule an appointment as soon as possible for a visit in 1 week(s).   Specialty: Internal Medicine Contact information: [address], [place] [address] [phone]                Discharge Exam: Danley Danker Weights   09/03/22 1257  Weight: 72.1 kg     Condition at discharge: fair  The results of significant diagnostics from this hospitalization (including imaging, microbiology,  ancillary and laboratory) are listed below for reference.   Imaging Studies: CT Angio Chest/Abd/Pel for Dissection W and/or Wo Contrast  Result Date: 09/03/2022 CLINICAL DATA:  Chest pain, back pain EXAM: CT ANGIOGRAPHY CHEST, ABDOMEN AND PELVIS TECHNIQUE: Non-contrast CT of the chest was initially obtained. Multidetector CT imaging through the chest, abdomen and pelvis was performed using the standard protocol during bolus administration of intravenous contrast. Multiplanar reconstructed images and MIPs were obtained and reviewed to evaluate the vascular anatomy.  RADIATION DOSE REDUCTION: This exam was performed according to the departmental dose-optimization program which includes automated exposure control, adjustment of the mA and/or kV according to patient size and/or use of iterative reconstruction technique. CONTRAST:  126m OMNIPAQUE IOHEXOL 350 MG/ML SOLN COMPARISON:  Chest radiograph done today FINDINGS: CTA CHEST FINDINGS Cardiovascular: There is no demonstrable mural hematoma in the noncontrast images of the thoracic aorta. Coronary artery calcifications are seen. There is homogeneous enhancement in thoracic aorta. There are no intraluminal filling defects in pulmonary artery branches. Mediastinum/Nodes: No significant lymphadenopathy is seen. Lungs/Pleura: There is no focal pulmonary consolidation. There is no pleural effusion or pneumothorax. Musculoskeletal: No acute findings are seen in bony structures in thorax. Review of the MIP images confirms the above findings. CTA ABDOMEN AND PELVIS FINDINGS VASCULAR Aorta: There are scattered calcifications and plaque seen abdominal aorta without dissection or focal aneurysmal dilation. Celiac: There is no significant stenosis. SMA: There is no significant stenosis. Renals: There are coarse calcifications in the proximal courses of renal arteries without significant stenosis. IMA: Patent. Inflow: Unremarkable. Veins: Unremarkable. Review of the MIP images confirms the above findings. NON-VASCULAR Hepatobiliary: There is fatty infiltration. Surgical clips are seen in gallbladder fossa. Pancreas: No focal abnormalities are seen. Spleen: Inhomogeneous enhancement most likely is due to the arterial phase of the imaging sequence. Adrenals/Urinary Tract: There is mild hyperplasia of left adrenal. There is no hydronephrosis. There are no renal or ureteral stones. Urinary bladder is distended. Stomach/Bowel: Small hiatal hernia is seen. Small bowel loops are not dilated. Appendix is not seen. There is no significant wall  thickening and colon. Scattered diverticula are seen without signs of focal acute diverticulitis. Lymphatic: Unremarkable. Reproductive: Uterus is not seen. Other: There is no ascites or pneumoperitoneum. Small umbilical hernia containing fat is seen. Musculoskeletal: There is fusion of bodies of L2 and L3 vertebrae. No acute findings are seen in bony structures. Review of the MIP images confirms the above findings. IMPRESSION: There is no evidence of dissection in thoracic and abdominal aorta. Major branches of thoracic and abdominal aorta are patent. Coronary artery calcifications are seen. There is no evidence of pulmonary artery embolism. There is no focal pulmonary consolidation. There is no evidence of intestinal obstruction or pneumoperitoneum. There is no hydronephrosis. Fatty liver. Diverticulosis of colon without signs of diverticulitis. Other findings as described in the body of the report. Electronically Signed   By: PElmer PickerM.D.   On: 09/03/2022 18:06   DG Chest 2 View  Result Date: 09/03/2022 CLINICAL DATA:  Chest pain. EXAM: CHEST - 2 VIEW COMPARISON:  None Available. FINDINGS: The heart size and mediastinal contours are within normal limits. Possible pulmonary nodule in the right midlung. Both lungs are otherwise clear. No visible pleural effusions or pneumothorax. No acute osseous abnormality. Cholecystectomy clips. IMPRESSION: 1. Possible pulmonary nodule in the right midlung. Recommend chest CT to further evaluate. 2. Multiple no evidence of acute cardiopulmonary disease. Electronically Signed   By: FMargaretha SheffieldM.D.   On: 09/03/2022  13:20    Microbiology: No results found for this or any previous visit.  Labs: CBC: Recent Labs  Lab 09/03/22 1259 09/04/22 0607  WBC 11.7* 10.8*  HGB 13.6 12.5  HCT 43.5 39.1  MCV 85.0 83.0  PLT 301 276   Basic Metabolic Panel: Recent Labs  Lab 09/03/22 1259 09/04/22 0607  NA 138 138  K 3.8 3.4*  CL 103 103  CO2 27 27   GLUCOSE 105* 121*  BUN 17 19  CREATININE 0.92 0.85  CALCIUM 9.2 9.1   Liver Function Tests: No results for input(s): "AST", "ALT", "ALKPHOS", "BILITOT", "PROT", "ALBUMIN" in the last 168 hours. CBG: Recent Labs  Lab 09/04/22 0805  GLUCAP 108*    Discharge time spent: greater than 30 minutes.  Signed: Fritzi Mandes, MD [facility] 09/04/2022

## 2022-10-02 ENCOUNTER — Other Ambulatory Visit: Payer: Self-pay | Admitting: Physician Assistant

## 2022-10-02 DIAGNOSIS — R519 Headache, unspecified: Secondary | ICD-10-CM

## 2022-10-02 DIAGNOSIS — R202 Paresthesia of skin: Secondary | ICD-10-CM

## 2022-10-02 DIAGNOSIS — R262 Difficulty in walking, not elsewhere classified: Secondary | ICD-10-CM

## 2022-10-02 DIAGNOSIS — R200 Anesthesia of skin: Secondary | ICD-10-CM

## 2022-10-08 ENCOUNTER — Ambulatory Visit (INDEPENDENT_AMBULATORY_CARE_PROVIDER_SITE_OTHER): Payer: Medicare HMO | Admitting: Dermatology

## 2022-10-08 DIAGNOSIS — L820 Inflamed seborrheic keratosis: Secondary | ICD-10-CM

## 2022-10-08 DIAGNOSIS — L821 Other seborrheic keratosis: Secondary | ICD-10-CM

## 2022-10-08 DIAGNOSIS — Z86018 Personal history of other benign neoplasm: Secondary | ICD-10-CM

## 2022-10-08 DIAGNOSIS — L578 Other skin changes due to chronic exposure to nonionizing radiation: Secondary | ICD-10-CM | POA: Diagnosis not present

## 2022-10-08 DIAGNOSIS — D229 Melanocytic nevi, unspecified: Secondary | ICD-10-CM

## 2022-10-08 DIAGNOSIS — L918 Other hypertrophic disorders of the skin: Secondary | ICD-10-CM

## 2022-10-08 DIAGNOSIS — L814 Other melanin hyperpigmentation: Secondary | ICD-10-CM

## 2022-10-08 DIAGNOSIS — Z1283 Encounter for screening for malignant neoplasm of skin: Secondary | ICD-10-CM | POA: Diagnosis not present

## 2022-10-08 NOTE — Patient Instructions (Addendum)
Cryotherapy Aftercare  Wash gently with soap and water everyday.   Apply Vaseline and Band-Aid daily until healed.   Melanoma ABCDEs  Melanoma is the most dangerous type of skin cancer, and is the leading cause of death from skin disease.  You are more likely to develop melanoma if you: Have light-colored skin, light-colored eyes, or red or blond hair Spend a lot of time in the sun Tan regularly, either outdoors or in a tanning bed Have had blistering sunburns, especially during childhood Have a close family member who has had a melanoma Have atypical moles or large birthmarks  Early detection of melanoma is key since treatment is typically straightforward and cure rates are extremely high if we catch it early.   The first sign of melanoma is often a change in a mole or a new dark spot.  The ABCDE system is a way of remembering the signs of melanoma.  A for asymmetry:  The two halves do not match. B for border:  The edges of the growth are irregular. C for color:  A mixture of colors are present instead of an even brown color. D for diameter:  Melanomas are usually (but not always) greater than 6mm - the size of a pencil eraser. E for evolution:  The spot keeps changing in size, shape, and color.  Please check your skin once per month between visits. You can use a small mirror in front and a large mirror behind you to keep an eye on the back side or your body.   If you see any new or changing lesions before your next follow-up, please call to schedule a visit.  Please continue daily skin protection including broad spectrum sunscreen SPF 30+ to sun-exposed areas, reapplying every 2 hours as needed when you're outdoors.     Due to recent changes in healthcare laws, you may see results of your pathology and/or laboratory studies on MyChart before the doctors have had a chance to review them. We understand that in some cases there may be results that are confusing or concerning to you.  Please understand that not all results are received at the same time and often the doctors may need to interpret multiple results in order to provide you with the best plan of care or course of treatment. Therefore, we ask that you please give us 2 business days to thoroughly review all your results before contacting the office for clarification. Should we see a critical lab result, you will be contacted sooner.   If You Need Anything After Your Visit  If you have any questions or concerns for your doctor, please call our main line at [phone] and press option 4 to reach your doctor's medical assistant. If no one answers, please leave a voicemail as directed and we will return your call as soon as possible. Messages left after 4 pm will be answered the following business day.   You may also send us a message via MyChart. We typically respond to MyChart messages within 1-2 business days.  For prescription refills, please ask your pharmacy to contact our office. Our fax number is [phone].  If you have an urgent issue when the clinic is closed that cannot wait until the next business day, you can page your doctor at the number below.    Please note that while we do our best to be available for urgent issues outside of office hours, we are not available 24/7.   If you have an urgent   issue and are unable to reach us, you may choose to seek medical care at your doctor's office, retail clinic, urgent care center, or emergency room.  If you have a medical emergency, please immediately call 911 or go to the emergency department.  Pager Numbers  - Dr. Kowalski: [phone]  - Dr. Moye: [phone]  - Dr. Stewart: [phone]  In the event of inclement weather, please call our main line at [phone] for an update on the status of any delays or closures.  Dermatology Medication Tips: Please keep the boxes that topical medications come in in order to help keep track of the  instructions about where and how to use these. Pharmacies typically print the medication instructions only on the boxes and not directly on the medication tubes.   If your medication is too expensive, please contact our office at [phone] option 4 or send us a message through MyChart.   We are unable to tell what your co-pay for medications will be in advance as this is different depending on your insurance coverage. However, we may be able to find a substitute medication at lower cost or fill out paperwork to get insurance to cover a needed medication.   If a prior authorization is required to get your medication covered by your insurance company, please allow us 1-2 business days to complete this process.  Drug prices often vary depending on where the prescription is filled and some pharmacies may offer cheaper prices.  The website www.goodrx.com contains coupons for medications through different pharmacies. The prices here do not account for what the cost may be with help from insurance (it may be cheaper with your insurance), but the website can give you the price if you did not use any insurance.  - You can print the associated coupon and take it with your prescription to the pharmacy.  - You may also stop by our office during regular business hours and pick up a GoodRx coupon card.  - If you need your prescription sent electronically to a different pharmacy, notify our office through [facility] MyChart or by phone at [phone] option 4.     Si Usted Necesita Algo Despus de Su Visita  Tambin puede enviarnos un mensaje a travs de MyChart. Por lo general respondemos a los mensajes de MyChart en el transcurso de 1 a 2 das hbiles.  Para renovar recetas, por favor pida a su farmacia que se ponga en contacto con nuestra oficina. Nuestro nmero de fax es el [phone].  Si tiene un asunto urgente cuando la clnica est cerrada y que no puede esperar hasta el siguiente da hbil,  puede llamar/localizar a su doctor(a) al nmero que aparece a continuacin.   Por favor, tenga en cuenta que aunque hacemos todo lo posible para estar disponibles para asuntos urgentes fuera del horario de oficina, no estamos disponibles las 24 horas del da, los 7 das de la semana.   Si tiene un problema urgente y no puede comunicarse con nosotros, puede optar por buscar atencin mdica  en el consultorio de su doctor(a), en una clnica privada, en un centro de atencin urgente o en una sala de emergencias.  Si tiene una emergencia mdica, por favor llame inmediatamente al 911 o vaya a la sala de emergencias.  Nmeros de bper  - Dr. Kowalski: [phone]  - Dra. Moye: [phone]  - Dra. Stewart: [phone]  En caso de inclemencias del tiempo, por favor llame a nuestra lnea principal al [phone] para una actualizacin   sobre el estado de cualquier retraso o cierre.  Consejos para la medicacin en dermatologa: Por favor, guarde las cajas en las que vienen los medicamentos de uso tpico para ayudarle a seguir las instrucciones sobre dnde y cmo usarlos. Las farmacias generalmente imprimen las instrucciones del medicamento slo en las cajas y no directamente en los tubos del medicamento.   Si su medicamento es muy caro, por favor, pngase en contacto con nuestra oficina llamando al [phone] y presione la opcin 4 o envenos un mensaje a travs de MyChart.   No podemos decirle cul ser su copago por los medicamentos por adelantado ya que esto es diferente dependiendo de la cobertura de su seguro. Sin embargo, es posible que podamos encontrar un medicamento sustituto a menor costo o llenar un formulario para que el seguro cubra el medicamento que se considera necesario.   Si se requiere una autorizacin previa para que su compaa de seguros cubra su medicamento, por favor permtanos de 1 a 2 das hbiles para completar este proceso.  Los precios de los medicamentos varan con  frecuencia dependiendo del lugar de dnde se surte la receta y alguna farmacias pueden ofrecer precios ms baratos.  El sitio web www.goodrx.com tiene cupones para medicamentos de diferentes farmacias. Los precios aqu no tienen en cuenta lo que podra costar con la ayuda del seguro (puede ser ms barato con su seguro), pero el sitio web puede darle el precio si no utiliz ningn seguro.  - Puede imprimir el cupn correspondiente y llevarlo con su receta a la farmacia.  - Tambin puede pasar por nuestra oficina durante el horario de atencin regular y recoger una tarjeta de cupones de GoodRx.  - Si necesita que su receta se enve electrnicamente a una farmacia diferente, informe a nuestra oficina a travs de MyChart de [place] o por telfono llamando al [phone] y presione la opcin 4.  

## 2022-10-08 NOTE — Progress Notes (Unsigned)
Follow-Up Visit   Subjective  Emily Berry is a 71 y.o. female who presents for the following: Annual Exam. The patient presents for Total-Body Skin Exam (TBSE) for skin cancer screening and mole check.  The patient has spots, moles and lesions to be evaluated, some may be new or changing and the patient has concerns that these could be cancer. Patient with hx of dysplastic nevus.  The following portions of the chart were reviewed this encounter and updated as appropriate:   Tobacco  Allergies  Meds  Problems  Med Hx  Surg Hx  Fam Hx     Review of Systems:  No other skin or systemic complaints except as noted in HPI or Assessment and Plan.  Objective  Well appearing patient in no apparent distress; mood and affect are within normal limits.  A full examination was performed including scalp, head, eyes, ears, nose, lips, neck, chest, axillae, abdomen, back, buttocks, bilateral upper extremities, bilateral lower extremities, hands, feet, fingers, toes, fingernails, and toenails. All findings within normal limits unless otherwise noted below.  neck, face and ear x 15 (15) Erythematous stuck-on, waxy papule or plaque  neck x 8 (8) Fleshy, skin-colored pedunculated papules.     Assessment & Plan  Inflamed seborrheic keratosis (15) neck, face and ear x 15 Symptomatic, irritating, patient would like treated. Destruction of lesion - neck, face and ear x 15 Complexity: simple   Destruction method: cryotherapy   Informed consent: discussed and consent obtained   Timeout:  patient name, date of birth, surgical site, and procedure verified Lesion destroyed using liquid nitrogen: Yes   Region frozen until ice ball extended beyond lesion: Yes   Outcome: patient tolerated procedure well with no complications   Post-procedure details: wound care instructions given    Skin tag (8) neck x 8 Irritated   History of Dysplastic Nevi - No evidence of recurrence today at right lower  quadrant abdomen - Recommend regular full body skin exams - Recommend daily broad spectrum sunscreen SPF 30+ to sun-exposed areas, reapply every 2 hours as needed.  - Call if any new or changing lesions are noted between office visits  Lentigines - Scattered tan macules - Due to sun exposure - Benign-appearing, observe - Recommend daily broad spectrum sunscreen SPF 30+ to sun-exposed areas, reapply every 2 hours as needed. - Call for any changes  Seborrheic Keratoses - Stuck-on, waxy, tan-brown papules and/or plaques  - Benign-appearing - Discussed benign etiology and prognosis. - Observe - Call for any changes  Melanocytic Nevi - Tan-brown and/or pink-flesh-colored symmetric macules and papules - Benign appearing on exam today - Observation - Call clinic for new or changing moles - Recommend daily use of broad spectrum spf 30+ sunscreen to sun-exposed areas.   Hemangiomas - Red papules - Discussed benign nature - Observe - Call for any changes  Actinic Damage - Chronic condition, secondary to cumulative UV/sun exposure - diffuse scaly erythematous macules with underlying dyspigmentation - Recommend daily broad spectrum sunscreen SPF 30+ to sun-exposed areas, reapply every 2 hours as needed.  - Staying in the shade or wearing long sleeves, sun glasses (UVA+UVB protection) and wide brim hats (4-inch brim around the entire circumference of the hat) are also recommended for sun protection.  - Call for new or changing lesions.  Skin cancer screening performed today.  Return for TBSE 1-2 years.  Graciella Belton, RMA, am acting as scribe for Sarina Ser, MD . Documentation: I have reviewed the above documentation for  accuracy and completeness, and I agree with the above.  Sarina Ser, MD

## 2022-10-16 ENCOUNTER — Ambulatory Visit
Admission: RE | Admit: 2022-10-16 | Discharge: 2022-10-16 | Disposition: A | Discharge status: Home / Self Care | Payer: Medicare HMO | Source: Ambulatory Visit | Attending: Physician Assistant | Admitting: Physician Assistant

## 2022-10-16 DIAGNOSIS — R200 Anesthesia of skin: Secondary | ICD-10-CM

## 2022-10-16 DIAGNOSIS — R519 Headache, unspecified: Secondary | ICD-10-CM

## 2022-10-16 DIAGNOSIS — R202 Paresthesia of skin: Secondary | ICD-10-CM

## 2022-10-16 DIAGNOSIS — R262 Difficulty in walking, not elsewhere classified: Secondary | ICD-10-CM

## 2022-10-20 ENCOUNTER — Encounter: Payer: Self-pay | Admitting: Dermatology

## 2022-11-22 ENCOUNTER — Other Ambulatory Visit: Payer: Self-pay | Admitting: Physician Assistant

## 2022-11-22 DIAGNOSIS — R202 Paresthesia of skin: Secondary | ICD-10-CM

## 2022-11-22 DIAGNOSIS — R519 Headache, unspecified: Secondary | ICD-10-CM

## 2022-11-22 DIAGNOSIS — R200 Anesthesia of skin: Secondary | ICD-10-CM

## 2022-11-22 DIAGNOSIS — M542 Cervicalgia: Secondary | ICD-10-CM

## 2022-12-12 ENCOUNTER — Ambulatory Visit
Admission: RE | Admit: 2022-12-12 | Discharge: 2022-12-12 | Disposition: A | Discharge status: Home / Self Care | Payer: Medicare HMO | Source: Ambulatory Visit | Attending: Physician Assistant | Admitting: Physician Assistant

## 2022-12-12 DIAGNOSIS — R202 Paresthesia of skin: Secondary | ICD-10-CM

## 2022-12-12 DIAGNOSIS — R200 Anesthesia of skin: Secondary | ICD-10-CM

## 2022-12-12 DIAGNOSIS — M542 Cervicalgia: Secondary | ICD-10-CM

## 2022-12-12 DIAGNOSIS — R519 Headache, unspecified: Secondary | ICD-10-CM

## 2022-12-31 ENCOUNTER — Other Ambulatory Visit: Payer: Self-pay | Admitting: Internal Medicine

## 2022-12-31 DIAGNOSIS — Z1231 Encounter for screening mammogram for malignant neoplasm of breast: Secondary | ICD-10-CM

## 2023-01-17 ENCOUNTER — Ambulatory Visit
Admission: RE | Admit: 2023-01-17 | Discharge: 2023-01-17 | Disposition: A | Discharge status: Home / Self Care | Payer: Medicare HMO | Source: Ambulatory Visit | Attending: Internal Medicine | Admitting: Internal Medicine

## 2023-01-17 DIAGNOSIS — Z1231 Encounter for screening mammogram for malignant neoplasm of breast: Secondary | ICD-10-CM

## 2023-04-02 ENCOUNTER — Emergency Department: Payer: Medicare HMO

## 2023-04-02 ENCOUNTER — Emergency Department
Admission: EM | Admit: 2023-04-02 | Discharge: 2023-04-02 | Disposition: A | Discharge status: Home / Self Care | Payer: Medicare HMO | Attending: Emergency Medicine | Admitting: Emergency Medicine

## 2023-04-02 DIAGNOSIS — M1711 Unilateral primary osteoarthritis, right knee: Secondary | ICD-10-CM | POA: Diagnosis not present

## 2023-04-02 DIAGNOSIS — M25561 Pain in right knee: Secondary | ICD-10-CM | POA: Diagnosis present

## 2023-04-02 DIAGNOSIS — I10 Essential (primary) hypertension: Secondary | ICD-10-CM | POA: Insufficient documentation

## 2023-04-02 MED ORDER — CYCLOBENZAPRINE HCL 5 MG PO TABS: 5.0000 mg | ORAL_TABLET | Freq: Three times a day (TID) | ORAL | 0 refills | Status: DC | PRN

## 2023-04-02 MED ORDER — DICLOFENAC SODIUM 75 MG PO TBEC: 75.0000 mg | DELAYED_RELEASE_TABLET | Freq: Two times a day (BID) | ORAL | 0 refills | Status: AC

## 2023-04-02 NOTE — ED Provider Notes (Signed)
[facility] Emergency Department Provider Note     Event Date/Time   First MD Initiated Contact with Patient 04/02/23 1204     (approximate)   History   Knee Pain   HPI  Emily MarseilleCheryl L Winterbottom is a 72 y.o. female with history of HTN, HLD, and thyroid disease, presents to the ED for evaluation of right knee pain.  Patient reports 2 weeks of symptoms.  She denies any recent injury, trauma, fall patient denies any significant benefit with OTC medications and ice pack.   Physical Exam   Triage Vital Signs: ED Triage Vitals  Enc Vitals Group     BP 04/02/23 1140 (!) 156/84     Pulse Rate 04/02/23 1140 100     Resp 04/02/23 1140 18     Temp 04/02/23 1140 98.4 F (36.9 C)     Temp Source 04/02/23 1140 Oral     SpO2 04/02/23 1140 100 %     Weight 04/02/23 1141 159 lb (72.1 kg)     Height --      Head Circumference --      Peak Flow --      Pain Score 04/02/23 1141 10     Pain Loc --      Pain Edu? --      Excl. in GC? --     Most recent vital signs: Vitals:   04/02/23 1140  BP: (!) 156/84  Pulse: 100  Resp: 18  Temp: 98.4 F (36.9 C)  SpO2: 100%    General Awake, no distress. NAd CV:  Good peripheral perfusion.  RESP:  Normal effort.  ABD:  No distention.  MSK:  Right knee without obvious deformity, dislocation, or joint effusion.  Normal active range of motion on exam.  No valgus or varus joint stresses elicited.  No popliteal space fullness is noted.  Patient with no calf or Achilles tenderness noted distally.  Negative anterior/posterior drawer sign.  ED Results / Procedures / Treatments   Labs (all labs ordered are listed, but only abnormal results are displayed) Labs Reviewed - No data to display   EKG   RADIOLOGY  I personally viewed and evaluated these images as part of my medical decision making, as well as reviewing the written report by the radiologist.  ED Provider Interpretation: No Acute finding  No results  found.   PROCEDURES:  Critical Care performed: No  Procedures   MEDICATIONS ORDERED IN ED: Medications - No data to display   IMPRESSION / MDM / ASSESSMENT AND PLAN / ED COURSE  I reviewed the triage vital signs and the nursing notes.                              Differential diagnosis includes, but is not limited to, sprain, knee strain, joint effusion, DJD, OA, situs  Patient's presentation is most consistent with acute complicated illness / injury requiring diagnostic workup.  Patient's diagnosis is consistent with acute pain secondary to underlying osteoarthritis.  No evidence of internal derangement on exam.. Patient will be discharged home with prescriptions for diclofenac and cyclobenzaprine. Patient is to follow up with her primary provider as needed or otherwise directed. Patient is given ED precautions to return to the ED for any worsening or new symptoms.   FINAL CLINICAL IMPRESSION(S) / ED DIAGNOSES   Final diagnoses:  Acute pain of right knee  Primary osteoarthritis of right knee  Rx / DC Orders   ED Discharge Orders          Ordered    cyclobenzaprine (FLEXERIL) 5 MG tablet  3 times daily PRN        04/02/23 1307    diclofenac (VOLTAREN) 75 MG EC tablet  2 times daily        04/02/23 1307             Note:  This document was prepared using Dragon voice recognition software and may include unintentional dictation errors.    Lissa HoardMenshew, Joniyah Mallinger V Bacon, PA-C 04/04/23 0009    Jene EveryKinner, Robert, MD 04/07/23 1056

## 2023-04-02 NOTE — Discharge Instructions (Addendum)
Your exam is reassuring.  X-ray confirms evidence of osteoarthritis of the knee.  Take the prescription anti-inflammatory as directed.  Follow-up with primary provider or Dr. Martha Clan for ongoing joint pain.  Do not take more than the prescribed muscle relaxant or anti-inflammatory at 1 time.

## 2023-04-02 NOTE — ED Triage Notes (Signed)
Pt sts that she has been having right knee pain for the last two weeks. Pt denies seeing her PCP about the pain. Pt sts that she has been using OTC med and ice packs. Pt is ambulatory in triage without assistance

## 2023-04-09 ENCOUNTER — Other Ambulatory Visit: Payer: Self-pay | Admitting: Infectious Diseases

## 2023-04-09 DIAGNOSIS — R1013 Epigastric pain: Secondary | ICD-10-CM

## 2023-04-18 ENCOUNTER — Ambulatory Visit
Admission: RE | Admit: 2023-04-18 | Discharge: 2023-04-18 | Disposition: A | Discharge status: Home / Self Care | Payer: Medicare HMO | Source: Ambulatory Visit | Attending: Infectious Diseases | Admitting: Infectious Diseases

## 2023-04-18 DIAGNOSIS — R1013 Epigastric pain: Secondary | ICD-10-CM

## 2023-05-21 ENCOUNTER — Other Ambulatory Visit: Payer: Self-pay | Admitting: Orthopedic Surgery

## 2023-05-23 ENCOUNTER — Encounter
Admission: RE | Admit: 2023-05-23 | Discharge: 2023-05-23 | Disposition: A | Discharge status: Home / Self Care | Payer: Medicare HMO | Source: Ambulatory Visit | Attending: Orthopedic Surgery | Admitting: Orthopedic Surgery

## 2023-05-23 DIAGNOSIS — Z01810 Encounter for preprocedural cardiovascular examination: Secondary | ICD-10-CM

## 2023-05-23 DIAGNOSIS — Z01812 Encounter for preprocedural laboratory examination: Secondary | ICD-10-CM

## 2023-05-23 DIAGNOSIS — I10 Essential (primary) hypertension: Secondary | ICD-10-CM

## 2023-05-23 HISTORY — DX: Solitary pulmonary nodule: R91.1

## 2023-05-23 HISTORY — DX: Hyperlipidemia, unspecified: E78.5

## 2023-05-23 HISTORY — DX: Diverticulosis of intestine, part unspecified, without perforation or abscess without bleeding: K57.90

## 2023-05-23 HISTORY — DX: Unspecified hemorrhoids: K64.9

## 2023-05-23 HISTORY — DX: Fatty (change of) liver, not elsewhere classified: K76.0

## 2023-05-23 HISTORY — DX: Fibromyalgia: M79.7

## 2023-05-23 HISTORY — DX: Gastro-esophageal reflux disease without esophagitis: K21.9

## 2023-05-23 HISTORY — DX: Other complications of anesthesia, initial encounter: T88.59XA

## 2023-05-23 HISTORY — DX: Depression, unspecified: F32.A

## 2023-05-23 HISTORY — DX: Chest pain, unspecified: R07.9

## 2023-05-23 HISTORY — DX: Dyspnea, unspecified: R06.00

## 2023-05-23 HISTORY — DX: Other specified postprocedural states: Z98.890

## 2023-05-23 HISTORY — DX: Migraine, unspecified, not intractable, without status migrainosus: G43.909

## 2023-05-23 HISTORY — DX: Obstructive sleep apnea (adult) (pediatric): G47.33

## 2023-05-23 HISTORY — DX: Essential (primary) hypertension: I10

## 2023-05-23 NOTE — Patient Instructions (Signed)
Your procedure is scheduled on:05-28-23 Tuesday Report to the Registration Desk on the 1st floor of the [facility].Then proceed to the 2nd floor Surgery Desk To find out your arrival time, please call [phone] between 1PM - 3PM on:05-27-23 Monday If your arrival time is 6:00 am, do not arrive before that time as the [facility] entrance doors do not open until 6:00 am.  REMEMBER: Instructions that are not followed completely may result in serious medical risk, up to and including death; or upon the discretion of your surgeon and anesthesiologist your surgery may need to be rescheduled.  Do not eat food OR drink any liquids after midnight the night before surgery.  No gum chewing or hard candies.  One week prior to surgery: Stop Anti-inflammatories (NSAIDS) such as Advil, Aleve, Ibuprofen, Motrin, Naproxen, Naprosyn and Aspirin based products such as Excedrin, Goody's Powder, BC Powder.You may however, take Tylenol if needed for pain up until the day of surgery. Stop ANY OVER THE COUNTER supplements/vitamins NOW (05-23-23) until after surgery (Calcium + D)  TAKE ONLY THESE MEDICATIONS THE MORNING OF SURGERY WITH A SIP OF WATER: -DULoxetine (CYMBALTA)  -levothyroxine (SYNTHROID, LEVOTHROID)  -omeprazole (PRILOSEC)  -pregabalin (LYRICA)   Do NOT take your losartan-hydrochlorothiazide (HYZAAR) the morning of surgery  No Alcohol for 24 hours before or after surgery.  No Smoking including e-cigarettes for 24 hours before surgery.  No chewable tobacco products for at least 6 hours before surgery.  No nicotine patches on the day of surgery.  Do not use any "recreational" drugs for at least a week (preferably 2 weeks) before your surgery.  Please be advised that the combination of cocaine and anesthesia may have negative outcomes, up to and including death. If you test positive for cocaine, your surgery will be cancelled.  On the morning of surgery brush your teeth with toothpaste and  water, you may rinse your mouth with mouthwash if you wish. Do not swallow any toothpaste or mouthwash.  Use CHG Soap as directed on instruction sheet.  Do not wear jewelry, make-up, hairpins, clips or nail polish.  Do not wear lotions, powders, or perfumes.   Do not shave body hair from the neck down 48 hours before surgery.  Contact lenses, hearing aids and dentures may not be worn into surgery.  Do not bring valuables to the hospital. [facility] is not responsible for any missing/lost belongings or valuables.   Bring your C-PAP to the hospital   Notify your doctor if there is any change in your medical condition (cold, fever, infection).  Wear comfortable clothing (specific to your surgery type) to the hospital.  After surgery, you can help prevent lung complications by doing breathing exercises.  Take deep breaths and cough every 1-2 hours. Your doctor may order a device called an Incentive Spirometer to help you take deep breaths. When coughing or sneezing, hold a pillow firmly against your incision with both hands. This is called "splinting." Doing this helps protect your incision. It also decreases belly discomfort.  If you are being admitted to the hospital overnight, leave your suitcase in the car. After surgery it may be brought to your room.  In case of increased patient census, it may be necessary for you, the patient, to continue your postoperative care in the Same Day Surgery department.  If you are being discharged the day of surgery, you will not be allowed to drive home. You will need a responsible individual to drive you home and stay with  you for 24 hours after surgery.   If you are taking public transportation, you will need to have a responsible individual with you.  Please call the Pre-admissions Testing Dept. at [phone] if you have any questions about these instructions.  Surgery Visitation Policy:  Patients having surgery or a procedure may have  two visitors.  Children under the age of 61 must have an adult with them who is not the patient.     Preparing for Surgery with CHLORHEXIDINE GLUCONATE (CHG) Soap  Chlorhexidine Gluconate (CHG) Soap  o An antiseptic cleaner that kills germs and bonds with the skin to continue killing germs even after washing  o Used for showering the night before surgery and morning of surgery  Before surgery, you can play an important role by reducing the number of germs on your skin.  CHG (Chlorhexidine gluconate) soap is an antiseptic cleanser which kills germs and bonds with the skin to continue killing germs even after washing.  Please do not use if you have an allergy to CHG or antibacterial soaps. If your skin becomes reddened/irritated stop using the CHG.  1. Shower the NIGHT BEFORE SURGERY and the MORNING OF SURGERY with CHG soap.  2. If you choose to wash your hair, wash your hair first as usual with your normal shampoo.  3. After shampooing, rinse your hair and body thoroughly to remove the shampoo.  4. Use CHG as you would any other liquid soap. You can apply CHG directly to the skin and wash gently with a scrungie or a clean washcloth.  5. Apply the CHG soap to your body only from the neck down. Do not use on open wounds or open sores. Avoid contact with your eyes, ears, mouth, and genitals (private parts). Wash face and genitals (private parts) with your normal soap.  6. Wash thoroughly, paying special attention to the area where your surgery will be performed.  7. Thoroughly rinse your body with warm water.  8. Do not shower/wash with your normal soap after using and rinsing off the CHG soap.  9. Pat yourself dry with a clean towel.  10. Wear clean pajamas to bed the night before surgery.  12. Place clean sheets on your bed the night of your first shower and do not sleep with pets.  13. Shower again with the CHG soap on the day of surgery prior to arriving at the  hospital.  14. Do not apply any deodorants/lotions/powders.  15. Please wear clean clothes to the hospital.

## 2023-05-24 ENCOUNTER — Encounter
Admission: RE | Admit: 2023-05-24 | Discharge: 2023-05-24 | Disposition: A | Discharge status: Home / Self Care | Payer: Medicare HMO | Source: Ambulatory Visit | Attending: Orthopedic Surgery | Admitting: Orthopedic Surgery

## 2023-05-24 DIAGNOSIS — Z01810 Encounter for preprocedural cardiovascular examination: Secondary | ICD-10-CM

## 2023-05-24 DIAGNOSIS — I10 Essential (primary) hypertension: Secondary | ICD-10-CM | POA: Diagnosis not present

## 2023-05-24 DIAGNOSIS — Z01818 Encounter for other preprocedural examination: Secondary | ICD-10-CM | POA: Insufficient documentation

## 2023-05-24 DIAGNOSIS — Z01812 Encounter for preprocedural laboratory examination: Secondary | ICD-10-CM

## 2023-05-24 LAB — BASIC METABOLIC PANEL
Anion gap: 12 (ref 5–15)
BUN: 12 mg/dL (ref 8–23)
CO2: 24 mmol/L (ref 22–32)
Calcium: 8.9 mg/dL (ref 8.9–10.3)
Chloride: 95 mmol/L — ABNORMAL LOW (ref 98–111)
Creatinine, Ser: 0.84 mg/dL (ref 0.44–1.00)
GFR, Estimated: >60 mL/min (ref >60)
Glucose, Bld: 108 mg/dL — ABNORMAL HIGH (ref 70–99)
Potassium: 3.4 mmol/L — ABNORMAL LOW (ref 3.5–5.1)
Sodium: 131 mmol/L — ABNORMAL LOW (ref 135–145)

## 2023-05-24 LAB — CBC
HCT: 39.1 % (ref 36.0–46.0)
Hemoglobin: 12.9 g/dL (ref 12.0–15.0)
MCH: 28.1 pg (ref 26.0–34.0)
MCHC: 33 g/dL (ref 30.0–36.0)
MCV: 85.2 fL (ref 80.0–100.0)
Platelets: 251 10*3/uL (ref 150–400)
RBC: 4.59 MIL/uL (ref 3.87–5.11)
RDW: 13.1 % (ref 11.5–15.5)
WBC: 9.3 10*3/uL (ref 4.0–10.5)
nRBC: 0 % (ref 0.0–0.2)

## 2023-05-27 MED ORDER — ORAL CARE MOUTH RINSE: 15.0000 mL | Freq: Once | OROMUCOSAL | Status: AC

## 2023-05-27 MED ORDER — CHLORHEXIDINE GLUCONATE 0.12 % MT SOLN
15.0000 mL | Freq: Once | OROMUCOSAL | Status: AC
  Administered 2023-05-28: 15 mL via OROMUCOSAL

## 2023-05-27 MED ORDER — ACETAMINOPHEN 500 MG PO TABS
1000.0000 mg | ORAL_TABLET | ORAL | Status: AC
  Administered 2023-05-28: 1000 mg via ORAL

## 2023-05-27 MED ORDER — LACTATED RINGERS IV SOLN: INTRAVENOUS | Status: DC

## 2023-05-27 MED ORDER — CHLORHEXIDINE GLUCONATE CLOTH 2 % EX PADS: 6.0000 | MEDICATED_PAD | Freq: Once | CUTANEOUS | Status: DC

## 2023-05-27 MED ORDER — CHLORHEXIDINE GLUCONATE CLOTH 2 % EX PADS
6.0000 | MEDICATED_PAD | Freq: Once | CUTANEOUS | Status: AC
  Administered 2023-05-28: 6 via TOPICAL

## 2023-05-27 MED ORDER — CEFAZOLIN SODIUM-DEXTROSE 2-4 GM/100ML-% IV SOLN
2.0000 g | INTRAVENOUS | Status: AC
  Administered 2023-05-28: 2 g via INTRAVENOUS

## 2023-05-28 ENCOUNTER — Ambulatory Visit: Payer: Medicare HMO | Admitting: Anesthesiology

## 2023-05-28 ENCOUNTER — Other Ambulatory Visit: Payer: Self-pay

## 2023-05-28 ENCOUNTER — Ambulatory Visit: Payer: Medicare HMO | Admitting: Urgent Care

## 2023-05-28 ENCOUNTER — Encounter
Admission: RE | Disposition: A | Discharge status: Home / Self Care | Payer: Self-pay | Source: Ambulatory Visit | Attending: Orthopedic Surgery

## 2023-05-28 ENCOUNTER — Encounter: Payer: Self-pay | Admitting: Orthopedic Surgery

## 2023-05-28 ENCOUNTER — Ambulatory Visit
Admission: RE | Admit: 2023-05-28 | Discharge: 2023-05-28 | Disposition: A | Discharge status: Home / Self Care | Payer: Medicare HMO | Source: Ambulatory Visit | Attending: Orthopedic Surgery | Admitting: Orthopedic Surgery

## 2023-05-28 DIAGNOSIS — X58XXXA Exposure to other specified factors, initial encounter: Secondary | ICD-10-CM | POA: Diagnosis not present

## 2023-05-28 DIAGNOSIS — S83241A Other tear of medial meniscus, current injury, right knee, initial encounter: Secondary | ICD-10-CM | POA: Insufficient documentation

## 2023-05-28 DIAGNOSIS — M2241 Chondromalacia patellae, right knee: Secondary | ICD-10-CM | POA: Insufficient documentation

## 2023-05-28 DIAGNOSIS — S83281A Other tear of lateral meniscus, current injury, right knee, initial encounter: Secondary | ICD-10-CM | POA: Diagnosis not present

## 2023-05-28 HISTORY — PX: KNEE ARTHROSCOPY WITH MEDIAL MENISECTOMY: SHX5651

## 2023-05-28 SURGERY — ARTHROSCOPY, KNEE, WITH MEDIAL MENISCECTOMY
Anesthesia: General | Site: Knee | Laterality: Right

## 2023-05-28 MED ORDER — KETAMINE HCL 10 MG/ML IJ SOLN
INTRAMUSCULAR | Status: DC | PRN
  Administered 2023-05-28: 30 mg via INTRAVENOUS

## 2023-05-28 MED ORDER — BUPIVACAINE-EPINEPHRINE (PF) 0.25% -1:200000 IJ SOLN
INTRAMUSCULAR | Status: DC | PRN
  Administered 2023-05-28: 30 mL via PERINEURAL

## 2023-05-28 MED ORDER — LIDOCAINE HCL (PF) 1 % IJ SOLN
INTRAMUSCULAR | Status: DC | PRN
  Administered 2023-05-28: 5 mL

## 2023-05-28 MED ORDER — PROPOFOL 1000 MG/100ML IV EMUL
INTRAVENOUS | Status: AC
  Filled 2023-05-28: qty 100

## 2023-05-28 MED ORDER — FENTANYL CITRATE (PF) 100 MCG/2ML IJ SOLN
INTRAMUSCULAR | Status: DC | PRN
  Administered 2023-05-28 (×2): 50 ug via INTRAVENOUS

## 2023-05-28 MED ORDER — ONDANSETRON HCL 4 MG PO TABS: 4.0000 mg | ORAL_TABLET | Freq: Three times a day (TID) | ORAL | 0 refills | Status: AC | PRN

## 2023-05-28 MED ORDER — ACETAMINOPHEN 500 MG PO TABS
ORAL_TABLET | ORAL | Status: AC
  Filled 2023-05-28: qty 2

## 2023-05-28 MED ORDER — PHENYLEPHRINE 80 MCG/ML (10ML) SYRINGE FOR IV PUSH (FOR BLOOD PRESSURE SUPPORT)
PREFILLED_SYRINGE | INTRAVENOUS | Status: AC
  Filled 2023-05-28: qty 10

## 2023-05-28 MED ORDER — ONDANSETRON HCL 4 MG/2ML IJ SOLN
INTRAMUSCULAR | Status: DC | PRN
  Administered 2023-05-28: 4 mg via INTRAVENOUS

## 2023-05-28 MED ORDER — LIDOCAINE HCL (PF) 1 % IJ SOLN
INTRAMUSCULAR | Status: AC
  Filled 2023-05-28: qty 30

## 2023-05-28 MED ORDER — LIDOCAINE HCL (CARDIAC) PF 100 MG/5ML IV SOSY
PREFILLED_SYRINGE | INTRAVENOUS | Status: DC | PRN
  Administered 2023-05-28: 100 mg via INTRAVENOUS

## 2023-05-28 MED ORDER — OXYCODONE HCL 5 MG PO TABS: 5.0000 mg | ORAL_TABLET | ORAL | 0 refills | Status: AC | PRN

## 2023-05-28 MED ORDER — ONDANSETRON HCL 4 MG/2ML IJ SOLN
INTRAMUSCULAR | Status: AC
  Filled 2023-05-28: qty 2

## 2023-05-28 MED ORDER — PROPOFOL 10 MG/ML IV BOLUS
INTRAVENOUS | Status: DC | PRN
  Administered 2023-05-28: 160 ug/kg/min via INTRAVENOUS
  Administered 2023-05-28: 120 mg via INTRAVENOUS

## 2023-05-28 MED ORDER — OXYCODONE HCL 5 MG/5ML PO SOLN: 5.0000 mg | Freq: Once | ORAL | Status: AC | PRN

## 2023-05-28 MED ORDER — CEFAZOLIN SODIUM-DEXTROSE 2-4 GM/100ML-% IV SOLN
INTRAVENOUS | Status: AC
  Filled 2023-05-28: qty 100

## 2023-05-28 MED ORDER — PROPOFOL 10 MG/ML IV BOLUS
INTRAVENOUS | Status: AC
  Filled 2023-05-28: qty 20

## 2023-05-28 MED ORDER — KETOROLAC TROMETHAMINE 30 MG/ML IJ SOLN
INTRAMUSCULAR | Status: DC | PRN
  Administered 2023-05-28: 15 mg via INTRAVENOUS

## 2023-05-28 MED ORDER — FENTANYL CITRATE (PF) 100 MCG/2ML IJ SOLN: 25.0000 ug | INTRAMUSCULAR | Status: DC | PRN

## 2023-05-28 MED ORDER — ASPIRIN 325 MG PO TBEC: 325.0000 mg | DELAYED_RELEASE_TABLET | Freq: Every day | ORAL | 0 refills | Status: AC

## 2023-05-28 MED ORDER — GLYCOPYRROLATE 0.2 MG/ML IJ SOLN
INTRAMUSCULAR | Status: DC | PRN
  Administered 2023-05-28: .1 mg via INTRAVENOUS

## 2023-05-28 MED ORDER — RINGERS IRRIGATION IR SOLN
Status: DC | PRN
  Administered 2023-05-28: 9000 mL

## 2023-05-28 MED ORDER — LIDOCAINE HCL (PF) 2 % IJ SOLN
INTRAMUSCULAR | Status: AC
  Filled 2023-05-28: qty 5

## 2023-05-28 MED ORDER — CHLORHEXIDINE GLUCONATE 0.12 % MT SOLN
OROMUCOSAL | Status: AC
  Filled 2023-05-28: qty 15

## 2023-05-28 MED ORDER — GLYCOPYRROLATE 0.2 MG/ML IJ SOLN
INTRAMUSCULAR | Status: AC
  Filled 2023-05-28: qty 1

## 2023-05-28 MED ORDER — BUPIVACAINE HCL (PF) 0.5 % IJ SOLN
INTRAMUSCULAR | Status: AC
  Filled 2023-05-28: qty 30

## 2023-05-28 MED ORDER — DEXAMETHASONE SODIUM PHOSPHATE 10 MG/ML IJ SOLN
INTRAMUSCULAR | Status: DC | PRN
  Administered 2023-05-28: 10 mg via INTRAVENOUS

## 2023-05-28 MED ORDER — DEXAMETHASONE SODIUM PHOSPHATE 10 MG/ML IJ SOLN
INTRAMUSCULAR | Status: AC
  Filled 2023-05-28: qty 1

## 2023-05-28 MED ORDER — KETAMINE HCL 50 MG/5ML IJ SOSY
PREFILLED_SYRINGE | INTRAMUSCULAR | Status: AC
  Filled 2023-05-28: qty 5

## 2023-05-28 MED ORDER — OXYCODONE HCL 5 MG PO TABS
ORAL_TABLET | ORAL | Status: AC
  Filled 2023-05-28: qty 1

## 2023-05-28 MED ORDER — KETOROLAC TROMETHAMINE 30 MG/ML IJ SOLN
INTRAMUSCULAR | Status: AC
  Filled 2023-05-28: qty 1

## 2023-05-28 MED ORDER — FENTANYL CITRATE (PF) 100 MCG/2ML IJ SOLN
INTRAMUSCULAR | Status: AC
  Filled 2023-05-28: qty 2

## 2023-05-28 MED ORDER — OXYCODONE HCL 5 MG PO TABS
5.0000 mg | ORAL_TABLET | Freq: Once | ORAL | Status: AC | PRN
  Administered 2023-05-28: 5 mg via ORAL

## 2023-05-28 MED ORDER — EPINEPHRINE PF 1 MG/ML IJ SOLN
INTRAMUSCULAR | Status: AC
  Filled 2023-05-28: qty 1

## 2023-05-28 SURGICAL SUPPLY — 42 items
ADAPTER IRRIG TUBE 2 SPIKE SOL (ADAPTER) ×2 IMPLANT
ADPR TBG 2 SPK PMP STRL ASCP (ADAPTER) ×2
BLADE FULL RADIUS 3.5 (BLADE) IMPLANT
BLADE SHAVER 4.5X7 STR FR (MISCELLANEOUS) IMPLANT
BUR BR 5.5 WIDE MOUTH (BURR) IMPLANT
CUFF TOURN SGL QUICK 24 (TOURNIQUET CUFF)
CUFF TOURN SGL QUICK 34 (TOURNIQUET CUFF)
CUFF TRNQT CYL 24X4X16.5-23 (TOURNIQUET CUFF) IMPLANT
CUFF TRNQT CYL 34X4.125X (TOURNIQUET CUFF) IMPLANT
DRAPE ARTHRO LIMB 89X125 STRL (DRAPES) ×1 IMPLANT
DRAPE IMP U-DRAPE 54X76 (DRAPES) ×1 IMPLANT
DURAPREP 26ML APPLICATOR (WOUND CARE) ×3 IMPLANT
GAUZE SPONGE 4X4 12PLY STRL (GAUZE/BANDAGES/DRESSINGS) ×1 IMPLANT
GAUZE XEROFORM 1X8 LF (GAUZE/BANDAGES/DRESSINGS) ×1 IMPLANT
GLOVE BIOGEL PI IND STRL 9 (GLOVE) ×1 IMPLANT
GLOVE BIOGEL PI ORTHO SZ9 (GLOVE) ×4 IMPLANT
GOWN STRL REUS W/ TWL LRG LVL3 (GOWN DISPOSABLE) ×1 IMPLANT
GOWN STRL REUS W/TWL 2XL LVL3 (GOWN DISPOSABLE) ×1 IMPLANT
GOWN STRL REUS W/TWL LRG LVL3 (GOWN DISPOSABLE) ×1
IV LACTATED RINGER IRRG 3000ML (IV SOLUTION) ×6
IV LR IRRIG 3000ML ARTHROMATIC (IV SOLUTION) ×6 IMPLANT
KIT TURNOVER KIT A (KITS) ×1 IMPLANT
MANIFOLD NEPTUNE II (INSTRUMENTS) ×2 IMPLANT
MAT ABSORB FLUID 56X50 GRAY (MISCELLANEOUS) ×1 IMPLANT
NDL HYPO 22X1.5 SAFETY MO (MISCELLANEOUS) ×1 IMPLANT
NEEDLE HYPO 22X1.5 SAFETY MO (MISCELLANEOUS) ×1 IMPLANT
PACK ARTHROSCOPY KNEE (MISCELLANEOUS) ×1 IMPLANT
PAD ABD DERMACEA PRESS 5X9 (GAUZE/BANDAGES/DRESSINGS) ×2 IMPLANT
SHAVER BLADE TAPERED BLUNT 4 (BLADE) IMPLANT
SLEEVE REMOTE CONTROL 5X12 (DRAPES) IMPLANT
SOL PREP PVP 2OZ (MISCELLANEOUS) ×1
SOLUTION PREP PVP 2OZ (MISCELLANEOUS) ×1 IMPLANT
SPONGE T-LAP 18X18 ~~LOC~~+RFID (SPONGE) ×1 IMPLANT
STRIP CLOSURE SKIN 1/2X4 (GAUZE/BANDAGES/DRESSINGS) ×1 IMPLANT
SUT ETHILON 4-0 (SUTURE) ×1
SUT ETHILON 4-0 FS2 18XMFL BLK (SUTURE) ×1
SUTURE ETHLN 4-0 FS2 18XMF BLK (SUTURE) ×1 IMPLANT
TRAP FLUID SMOKE EVACUATOR (MISCELLANEOUS) ×1 IMPLANT
TUBE SET DOUBLEFLO INFLOW (TUBING) ×1 IMPLANT
TUBE SET DOUBLEFLO OUTFLOW (TUBING) ×1 IMPLANT
WAND WEREWOLF FLOW 90D (MISCELLANEOUS) ×1 IMPLANT
WATER STERILE IRR 500ML POUR (IV SOLUTION) ×1 IMPLANT

## 2023-05-28 NOTE — Anesthesia Preprocedure Evaluation (Signed)
Anesthesia Evaluation  Patient identified by MRN, date of birth, ID band Patient awake    Reviewed: Allergy & Precautions, NPO status , Patient's Chart, lab work & pertinent test results  History of Anesthesia Complications (+) PONV and history of anesthetic complications  Airway Mallampati: II  TM Distance: >3 FB Neck ROM: full    Dental no notable dental hx.    Pulmonary sleep apnea and Continuous Positive Airway Pressure Ventilation    Pulmonary exam normal        Cardiovascular hypertension, On Medications (-) angina (-) DOE negative cardio ROS Normal cardiovascular exam     Neuro/Psych  Headaches PSYCHIATRIC DISORDERS  Depression     Neuromuscular disease    GI/Hepatic Neg liver ROS,GERD  Medicated and Controlled,,  Endo/Other  Hypothyroidism    Renal/GU      Musculoskeletal   Abdominal   Peds  Hematology negative hematology ROS (+)   Anesthesia Other Findings Past Medical History: No date: Chest pain No date: Complication of anesthesia No date: Depression No date: Diverticulosis 07/25/2015: Dysplastic nevus     Comment:  Right lower quadrant abdomen. Mild atypia, limited               margins free.  No date: Dyspnea No date: Fatty liver No date: Fibromyalgia No date: GERD (gastroesophageal reflux disease) No date: Hemorrhoids No date: Hyperlipidemia No date: Hypertension No date: Hypothyroidism No date: Lung nodule No date: Migraine headache No date: OSA on CPAP No date: PONV (postoperative nausea and vomiting) No date: Thyroid disease  Past Surgical History: No date: ABDOMINAL HYSTERECTOMY No date: BACK SURGERY     Comment:  lumbar No date: BUNIONECTOMY 05/16/2018: COLONOSCOPY WITH PROPOFOL; N/A     Comment:  Procedure: COLONOSCOPY WITH PROPOFOL;  Surgeon:               Pasty Spillers, MD;  Location: [facility] ENDOSCOPY;                Service: Endoscopy;  Laterality: N/A;  BMI     Body Mass Index: 32.10 kg/m      Reproductive/Obstetrics negative OB ROS                             Anesthesia Physical Anesthesia Plan  ASA: 2  Anesthesia Plan: General LMA   Post-op Pain Management: Toradol IV (intra-op)* and Ofirmev IV (intra-op)*   Induction: Intravenous  PONV Risk Score and Plan: 4 or greater and Dexamethasone, Ondansetron, Treatment may vary due to age or medical condition, Propofol infusion and TIVA  Airway Management Planned: LMA  Additional Equipment:   Intra-op Plan:   Post-operative Plan: Extubation in OR  Informed Consent: I have reviewed the patients History and Physical, chart, labs and discussed the procedure including the risks, benefits and alternatives for the proposed anesthesia with the patient or authorized representative who has indicated his/her understanding and acceptance.     Dental Advisory Given  Plan Discussed with: Anesthesiologist, CRNA and Surgeon  Anesthesia Plan Comments: (Patient consented for risks of anesthesia including but not limited to:  - adverse reactions to medications - damage to eyes, teeth, lips or other oral mucosa - nerve damage due to positioning  - sore throat or hoarseness - Damage to heart, brain, nerves, lungs, other parts of body or loss of life  Patient voiced understanding.)       Anesthesia Quick Evaluation

## 2023-05-28 NOTE — Op Note (Signed)
  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  PROCEDURE: Right knee arthroscopic partial medial and lateral meniscectomies and chondroplasty of the patellofemoral and medial compartments with limited synovectomy and debridement of medial plica  SURGEON:  Juanell Fairly, MD  ANESTHESIA:   General  PREOPERATIVE INDICATIONS:  Emily Berry  72 y.o. female with a diagnosis of medial meniscus tear confirmed by MRI who failed conservative management and elected for surgical management.    The risks benefits and alternatives were discussed with the patient preoperatively including the risks of infection, bleeding, nerve injury, knee stiffness, persistent pain, osteoarthritis and the need for further surgery. Medical  risks include DVT and pulmonary embolism, myocardial infarction, stroke, pneumonia, respiratory failure and death. The patient understood these risks and wished to proceed.  OPERATIVE PROCEDURE: Patient was met in the preoperative area. The operative extremity was signed with the word yes and my initials according the hospital's correct site of surgery protocol.  The patient was brought to the operating room where they was placed supine on the operative table. General anesthesia was administered. The patient was prepped and draped in a sterile fashion.  A timeout was performed to verify the patient's name, date of birth, medical record number, correct site of surgery correct procedure to be performed. It was also used to verify the patient received antibiotics that all appropriate instruments, and radiographic studies were available in the room. Once all in attendance were in agreement, the case began.  Proposed arthroscopy incisions were drawn out with a surgical marker. These were pre-injected with  1% lidocaine plain. An 11 blade was used to establish an inferior lateral and inferomedial portals. The inferomedial portal was created using a 18-gauge spinal needle under direct visualization.  A full diagnostic examination of the knee was performed including the suprapatellar pouch, patellofemoral joint, medial lateral compartments as well as the medial lateral gutters, the intercondylar notch in the posterior knee.  A limited synovectomy was performed including the medial lateral gutters and the suprapatellar pouch and the anterior knee.  A thickened and hyperemic medial plica was also debrided using a 90 degree ArthroCare wand.  Patient had the tear of the posterior horn of the medial meniscus then treated with a tapered shaver blade and straight duckbill basket. The meniscus was debrided until a stable rim was achieved. A chondroplasty of the medial femoral condyle, trochlea and undersurface of patella was also performed using the tapered shaver blade.   The knee was then positioned in a figure-of-four position.  The lateral meniscus was found to have a tear involving the inner margin of the body of the meniscus which was debrided using a tapered shaver blade.  The knee was then copiously lavaged. All arthroscopic instruments were removed. The two arthroscopy portals were closed with 4-0 nylon. Steri-Strips were applied along with a dry sterile and compressive dressing. The patient was brought to the PACU in stable condition. I was scrubbed and present for the entire case and all sharp and instrument counts were correct at the conclusion the case. I spoke with the patient's family member postoperatively to let them know the case was performed without complication and the patient was stable in the recovery room.   Kathreen Devoid, MD

## 2023-05-28 NOTE — Transfer of Care (Signed)
Immediate Anesthesia Transfer of Care Note  Patient: Emily Berry  Procedure(s) Performed: KNEE ARTHROSCOPY WITH MEDIAL MENISECTOMY (Right: Knee)  Patient Location: PACU  Anesthesia Type:General  Level of Consciousness: drowsy  Airway & Oxygen Therapy: Patient Spontanous Breathing and Patient connected to face mask oxygen  Post-op Assessment: Report given to RN and Post -op Vital signs reviewed and stable  Post vital signs: Reviewed and stable  Last Vitals:  Vitals Value Taken Time  BP 125/64 05/28/23 1631  Temp 35.8 1632  Pulse 66 05/28/23 1634  Resp 12 05/28/23 1635  SpO2 98 % 05/28/23 1634  Vitals shown include unvalidated device data.  Last Pain:  Vitals:   05/28/23 1309  TempSrc: Tympanic  PainSc: 7          Complications: No notable events documented.

## 2023-05-28 NOTE — Discharge Instructions (Signed)

## 2023-05-28 NOTE — Anesthesia Procedure Notes (Signed)
Procedure Name: LMA Insertion Date/Time: 05/28/2023 3:16 PM  Performed by: Morene Crocker, CRNAPre-anesthesia Checklist: Patient identified, Patient being monitored, Timeout performed, Emergency Drugs available and Suction available Patient Re-evaluated:Patient Re-evaluated prior to induction Oxygen Delivery Method: Circle system utilized Preoxygenation: Pre-oxygenation with 100% oxygen Induction Type: IV induction Ventilation: Mask ventilation without difficulty LMA: LMA inserted LMA Size: 3.0 Tube type: Oral Number of attempts: 1 Placement Confirmation: positive ETCO2 and breath sounds checked- equal and bilateral Tube secured with: Tape Dental Injury: Teeth and Oropharynx as per pre-operative assessment  Comments: Smooth, atraumatic LMA placement. No complications noted.

## 2023-05-28 NOTE — H&P (Signed)
PREOPERATIVE H&P  Chief Complaint: Right Knee Medial Mensicus Tear  HPI: Emily Berry is a 72 y.o. female who presents for preoperative history and physical with a diagnosis of Right Knee Medial Mensicus Tear confirmed by MRI . Symptoms of right knee pain and mechanical symptoms are significantly impairing activities of daily living.  Patient is failed nonoperative management and wished to proceed with an arthroscopic partial medial meniscectomy.  Past Medical History:  Diagnosis Date   Chest pain    Complication of anesthesia    Depression    Diverticulosis    Dysplastic nevus 07/25/2015   Right lower quadrant abdomen. Mild atypia, limited margins free.    Dyspnea    Fatty liver    Fibromyalgia    GERD (gastroesophageal reflux disease)    Hemorrhoids    Hyperlipidemia    Hypertension    Hypothyroidism    Lung nodule    Migraine headache    OSA on CPAP    PONV (postoperative nausea and vomiting)    Thyroid disease    Past Surgical History:  Procedure Laterality Date   ABDOMINAL HYSTERECTOMY     BACK SURGERY     lumbar   BUNIONECTOMY     COLONOSCOPY WITH PROPOFOL N/A 05/16/2018   Procedure: COLONOSCOPY WITH PROPOFOL;  Surgeon: Pasty Spillers, MD;  Location: [facility];  Service: Endoscopy;  Laterality: N/A;   Social History   Socioeconomic History   Marital status: Divorced    Spouse name: Not on file   Number of children: Not on file   Years of education: Not on file   Highest education level: Not on file  Occupational History   Not on file  Tobacco Use   Smoking status: Never   Smokeless tobacco: Never  Vaping Use   Vaping Use: Never used  Substance and Sexual Activity   Alcohol use: No   Drug use: No   Sexual activity: Not on file  Other Topics Concern   Not on file  Social History Narrative   Not on file   Social Determinants of Health   Financial Resource Strain: Not on file  Food Insecurity: No Food Insecurity (09/04/2022)   Hunger  Vital Sign    Worried About Running Out of Food in the Last Year: Never true    Ran Out of Food in the Last Year: Never true  Transportation Needs: No Transportation Needs (09/04/2022)   PRAPARE - Administrator, Civil Service (Medical): No    Lack of Transportation (Non-Medical): No  Physical Activity: Not on file  Stress: Not on file  Social Connections: Not on file   Family History  Problem Relation Age of Onset   Cancer Mother 21       cervical   Breast cancer Cousin    Cancer Brother        sinus, bone ca   Allergies  Allergen Reactions   Codeine Nausea Only   Prior to Admission medications   Medication Sig Start Date End Date Taking? Authorizing Provider  Calcium Carb-Cholecalciferol 600-10 MG-MCG TABS Take 1 tablet by mouth 2 (two) times daily.   Yes [provider]  DULoxetine (CYMBALTA) 60 MG capsule Take 60 mg by mouth every morning.   Yes [provider]  levothyroxine (SYNTHROID, LEVOTHROID) 50 MCG tablet Take 50 mcg by mouth daily before breakfast.   Yes [provider]  losartan-hydrochlorothiazide (HYZAAR) 50-12.5 MG tablet Take 1 tablet by mouth every morning.   Yes [provider]  omeprazole (PRILOSEC) 20 MG capsule Take 20 mg by mouth at bedtime.   Yes [provider]  pregabalin (LYRICA) 150 MG capsule Take 150 mg by mouth 2 (two) times daily.   Yes [provider]  simvastatin (ZOCOR) 40 MG tablet Take 40 mg by mouth at bedtime.   Yes [provider]  baclofen (LIORESAL) 10 MG tablet Take 10 mg by mouth 2 (two) times daily as needed for muscle spasms. Patient not taking: Reported on 05/28/2023    [provider]     Positive ROS: All other systems have been reviewed and were otherwise negative with the exception of those mentioned in the HPI and as above.  Physical Exam: General: Alert, no acute distress Cardiovascular: Regular rate and rhythm, no murmurs rubs or gallops.  No  pedal edema Respiratory: Clear to auscultation bilaterally, no wheezes rales or rhonchi. No cyanosis, no use of accessory musculature GI: No organomegaly, abdomen is soft and non-tender nondistended with positive bowel sounds. Skin: Skin intact, no lesions within the operative field. Neurologic: Sensation intact distally Psychiatric: Patient is competent for consent with normal mood and affect Lymphatic: No cervical lymphadenopathy  MUSCULOSKELETAL: Right knee: Patient's skin is intact.  Her range of motion is from 0 to 120 degrees.  She has tenderness over the medial joint line and a positive McMurray's test.  She is neurovascular intact.  Assessment: Right Knee Medial Mensicus Tear  Plan: Plan for Procedure(s): RIGHT KNEE ARTHROSCOPIC PARTIAL MEDIAL MENISECTOMY  I reviewed the details of the operation as well as the postoperative course with the patient.  A preop history and physical was performed at bedside.  The right knee was marked according to hospital's correct site of surgery protocol.  I discussed the risks and benefits of surgery. The risks include but are not limited to infection, bleeding, nerve or blood vessel injury, joint stiffness or loss of motion, persistent pain, weakness or instability, retear of the meniscus, osteoarthritis and the need for further surgery.  Patient understood these risks and wished to proceed.     Juanell Fairly, MD   05/28/2023 3:01 PM

## 2023-05-29 ENCOUNTER — Encounter: Payer: Self-pay | Admitting: Orthopedic Surgery

## 2023-05-29 NOTE — Anesthesia Postprocedure Evaluation (Signed)
Anesthesia Post Note  Patient: PACHIA SHAKER  Procedure(s) Performed: KNEE ARTHROSCOPY WITH MEDIAL MENISECTOMY (Right: Knee)  Patient location during evaluation: PACU Anesthesia Type: General Level of consciousness: awake and alert Pain management: pain level controlled Vital Signs Assessment: post-procedure vital signs reviewed and stable Respiratory status: spontaneous breathing, nonlabored ventilation, respiratory function stable and patient connected to nasal cannula oxygen Cardiovascular status: blood pressure returned to baseline and stable Postop Assessment: no apparent nausea or vomiting Anesthetic complications: no   No notable events documented.   Last Vitals:  Vitals:   05/28/23 1706 05/28/23 1726  BP: (!) 155/78 (!) 151/60  Pulse: 78 79  Resp: 11   Temp: (!) 36.2 C 36.6 C  SpO2: 95% 97%    Last Pain:  Vitals:   05/29/23 0849  TempSrc:   PainSc: 5                  Louie Boston

## 2023-05-29 NOTE — Progress Notes (Signed)
Called for postop f/u call. Patient stated she was not able to get her pain prescription filled at the [facility] on [facility] due to medication out of stock. She was in pain last night and today and only had Tylenol to take. I called the pharmacy this morning to verify that. They said it was available at the [facility] location. I then sent a message to Dr Martha Clan and notified Ms Rondinelli that she should be able to pick it up there today.

## 2023-07-03 ENCOUNTER — Other Ambulatory Visit: Payer: Self-pay

## 2023-07-03 DIAGNOSIS — R1013 Epigastric pain: Secondary | ICD-10-CM

## 2023-07-04 ENCOUNTER — Ambulatory Visit
Admission: RE | Admit: 2023-07-04 | Discharge: 2023-07-04 | Disposition: A | Discharge status: Home / Self Care | Payer: Medicare HMO | Source: Ambulatory Visit | Attending: Nurse Practitioner | Admitting: Nurse Practitioner

## 2023-07-04 DIAGNOSIS — R1013 Epigastric pain: Secondary | ICD-10-CM | POA: Diagnosis not present

## 2023-07-04 MED ORDER — IOHEXOL 300 MG/ML  SOLN
100.0000 mL | Freq: Once | INTRAMUSCULAR | Status: AC | PRN
  Administered 2023-07-04: 100 mL via INTRAVENOUS

## 2023-09-12 ENCOUNTER — Encounter: Payer: Self-pay | Admitting: Gastroenterology

## 2023-09-18 ENCOUNTER — Encounter: Payer: Self-pay | Admitting: Gastroenterology

## 2023-09-19 ENCOUNTER — Ambulatory Visit: Payer: Medicare HMO | Admitting: Anesthesiology

## 2023-09-19 ENCOUNTER — Ambulatory Visit
Admission: RE | Admit: 2023-09-19 | Discharge: 2023-09-19 | Disposition: A | Discharge status: Home / Self Care | Payer: Medicare HMO | Source: Ambulatory Visit | Attending: Gastroenterology | Admitting: Gastroenterology

## 2023-09-19 ENCOUNTER — Encounter
Admission: RE | Disposition: A | Discharge status: Home / Self Care | Payer: Self-pay | Source: Ambulatory Visit | Attending: Gastroenterology

## 2023-09-19 ENCOUNTER — Encounter: Payer: Self-pay | Admitting: Gastroenterology

## 2023-09-19 ENCOUNTER — Other Ambulatory Visit: Payer: Self-pay

## 2023-09-19 DIAGNOSIS — K642 Third degree hemorrhoids: Secondary | ICD-10-CM | POA: Diagnosis not present

## 2023-09-19 DIAGNOSIS — E039 Hypothyroidism, unspecified: Secondary | ICD-10-CM | POA: Insufficient documentation

## 2023-09-19 DIAGNOSIS — K319 Disease of stomach and duodenum, unspecified: Secondary | ICD-10-CM | POA: Diagnosis not present

## 2023-09-19 DIAGNOSIS — K760 Fatty (change of) liver, not elsewhere classified: Secondary | ICD-10-CM | POA: Diagnosis not present

## 2023-09-19 DIAGNOSIS — K644 Residual hemorrhoidal skin tags: Secondary | ICD-10-CM | POA: Insufficient documentation

## 2023-09-19 DIAGNOSIS — Z83719 Family history of colon polyps, unspecified: Secondary | ICD-10-CM | POA: Insufficient documentation

## 2023-09-19 DIAGNOSIS — K5730 Diverticulosis of large intestine without perforation or abscess without bleeding: Secondary | ICD-10-CM | POA: Diagnosis not present

## 2023-09-19 DIAGNOSIS — D123 Benign neoplasm of transverse colon: Secondary | ICD-10-CM | POA: Diagnosis not present

## 2023-09-19 DIAGNOSIS — R1013 Epigastric pain: Secondary | ICD-10-CM | POA: Insufficient documentation

## 2023-09-19 DIAGNOSIS — E785 Hyperlipidemia, unspecified: Secondary | ICD-10-CM | POA: Insufficient documentation

## 2023-09-19 DIAGNOSIS — K219 Gastro-esophageal reflux disease without esophagitis: Secondary | ICD-10-CM | POA: Insufficient documentation

## 2023-09-19 DIAGNOSIS — F32A Depression, unspecified: Secondary | ICD-10-CM | POA: Diagnosis not present

## 2023-09-19 DIAGNOSIS — G43909 Migraine, unspecified, not intractable, without status migrainosus: Secondary | ICD-10-CM | POA: Insufficient documentation

## 2023-09-19 DIAGNOSIS — M797 Fibromyalgia: Secondary | ICD-10-CM | POA: Diagnosis not present

## 2023-09-19 DIAGNOSIS — I10 Essential (primary) hypertension: Secondary | ICD-10-CM | POA: Insufficient documentation

## 2023-09-19 DIAGNOSIS — Z86010 Personal history of colonic polyps: Secondary | ICD-10-CM | POA: Diagnosis not present

## 2023-09-19 DIAGNOSIS — G4733 Obstructive sleep apnea (adult) (pediatric): Secondary | ICD-10-CM | POA: Insufficient documentation

## 2023-09-19 DIAGNOSIS — Z1211 Encounter for screening for malignant neoplasm of colon: Secondary | ICD-10-CM | POA: Insufficient documentation

## 2023-09-19 DIAGNOSIS — R1310 Dysphagia, unspecified: Secondary | ICD-10-CM | POA: Insufficient documentation

## 2023-09-19 HISTORY — PX: BIOPSY: SHX5522

## 2023-09-19 HISTORY — PX: ESOPHAGEAL DILATION: SHX303

## 2023-09-19 HISTORY — PX: COLONOSCOPY WITH PROPOFOL: SHX5780

## 2023-09-19 HISTORY — PX: SUBMUCOSAL TATTOO INJECTION: SHX6856

## 2023-09-19 HISTORY — PX: ESOPHAGOGASTRODUODENOSCOPY (EGD) WITH PROPOFOL: SHX5813

## 2023-09-19 HISTORY — PX: POLYPECTOMY: SHX5525

## 2023-09-19 SURGERY — COLONOSCOPY WITH PROPOFOL
Anesthesia: General

## 2023-09-19 MED ORDER — PROPOFOL 1000 MG/100ML IV EMUL
INTRAVENOUS | Status: AC
  Filled 2023-09-19: qty 100

## 2023-09-19 MED ORDER — PROPOFOL 500 MG/50ML IV EMUL
INTRAVENOUS | Status: DC | PRN
  Administered 2023-09-19: 75 ug/kg/min via INTRAVENOUS

## 2023-09-19 MED ORDER — LIDOCAINE HCL (CARDIAC) PF 100 MG/5ML IV SOSY
PREFILLED_SYRINGE | INTRAVENOUS | Status: DC | PRN
  Administered 2023-09-19: 60 mg via INTRAVENOUS

## 2023-09-19 MED ORDER — PROPOFOL 10 MG/ML IV BOLUS
INTRAVENOUS | Status: DC | PRN
  Administered 2023-09-19: 50 mg via INTRAVENOUS

## 2023-09-19 MED ORDER — DEXMEDETOMIDINE HCL IN NACL 200 MCG/50ML IV SOLN
INTRAVENOUS | Status: DC | PRN
  Administered 2023-09-19: 12 ug via INTRAVENOUS
  Administered 2023-09-19: 8 ug via INTRAVENOUS

## 2023-09-19 MED ORDER — SODIUM CHLORIDE 0.9 % IV SOLN: INTRAVENOUS | Status: DC

## 2023-09-19 NOTE — Anesthesia Postprocedure Evaluation (Signed)
Anesthesia Post Note  Patient: CHIRSTY BARTMESS  Procedure(s) Performed: COLONOSCOPY WITH PROPOFOL ESOPHAGOGASTRODUODENOSCOPY (EGD) WITH PROPOFOL ESOPHAGEAL DILATION BIOPSY POLYPECTOMY SUBMUCOSAL TATTOO INJECTION  Patient location during evaluation: PACU Anesthesia Type: General Level of consciousness: awake and awake and alert Pain management: satisfactory to patient Vital Signs Assessment: post-procedure vital signs reviewed and stable Respiratory status: spontaneous breathing and nonlabored ventilation Cardiovascular status: stable Anesthetic complications: no   No notable events documented.   Last Vitals:  Vitals:   09/19/23 0823 09/19/23 0833  BP: 116/64 126/80  Pulse: 68 69  Resp: 15 15  Temp: (!) 36.2 C   SpO2: 99% 98%    Last Pain:  Vitals:   09/19/23 0833  TempSrc:   PainSc: 0-No pain                 VAN STAVEREN,Sabella Traore

## 2023-09-19 NOTE — Anesthesia Preprocedure Evaluation (Signed)
Anesthesia Evaluation  Patient identified by MRN, date of birth, ID band Patient awake    Reviewed: Allergy & Precautions, NPO status , Patient's Chart, lab work & pertinent test results  History of Anesthesia Complications (+) PONV and history of anesthetic complications  Airway Mallampati: II  TM Distance: >3 FB Neck ROM: full    Dental  (+) Teeth Intact   Pulmonary neg pulmonary ROS, shortness of breath, sleep apnea and Continuous Positive Airway Pressure Ventilation    Pulmonary exam normal  + decreased breath sounds      Cardiovascular Exercise Tolerance: Good hypertension, Pt. on medications negative cardio ROS Normal cardiovascular exam Rhythm:Regular Rate:Normal     Neuro/Psych  Headaches   Depression    negative neurological ROS  negative psych ROS   GI/Hepatic negative GI ROS, Neg liver ROS,GERD  Medicated,,  Endo/Other  negative endocrine ROSHypothyroidism    Renal/GU negative Renal ROS  negative genitourinary   Musculoskeletal   Abdominal   Peds negative pediatric ROS (+)  Hematology negative hematology ROS (+)   Anesthesia Other Findings Past Medical History: No date: Chest pain No date: Complication of anesthesia No date: Depression No date: Diverticulosis 07/25/2015: Dysplastic nevus     Comment:  Right lower quadrant abdomen. Mild atypia, limited               margins free.  No date: Dyspnea No date: Fatty liver No date: Fibromyalgia No date: GERD (gastroesophageal reflux disease) No date: Hemorrhoids No date: Hyperlipidemia No date: Hypertension No date: Hypothyroidism No date: Lung nodule No date: Migraine headache No date: OSA on CPAP No date: PONV (postoperative nausea and vomiting) No date: Thyroid disease  Past Surgical History: No date: ABDOMINAL HYSTERECTOMY No date: BACK SURGERY     Comment:  lumbar No date: BUNIONECTOMY 05/16/2018: COLONOSCOPY WITH PROPOFOL; N/A      Comment:  Procedure: COLONOSCOPY WITH PROPOFOL;  Surgeon:               Pasty Spillers, MD;  Location: [facility];                Service: Endoscopy;  Laterality: N/A; 05/28/2023: KNEE ARTHROSCOPY WITH MEDIAL MENISECTOMY; Right     Comment:  Procedure: KNEE ARTHROSCOPY WITH MEDIAL MENISECTOMY;                Surgeon: Juanell Fairly, MD;  Location: [facility] ORS;                Service: Orthopedics;  Laterality: Right;  BMI    Body Mass Index: 29.89 kg/m      Reproductive/Obstetrics negative OB ROS                             Anesthesia Physical Anesthesia Plan  ASA: 2  Anesthesia Plan: General   Post-op Pain Management:    Induction: Intravenous  PONV Risk Score and Plan: Propofol infusion and TIVA  Airway Management Planned: Natural Airway  Additional Equipment:   Intra-op Plan:   Post-operative Plan:   Informed Consent: I have reviewed the patients History and Physical, chart, labs and discussed the procedure including the risks, benefits and alternatives for the proposed anesthesia with the patient or authorized representative who has indicated his/her understanding and acceptance.     Dental Advisory Given  Plan Discussed with: CRNA and Surgeon  Anesthesia Plan Comments:        Anesthesia Quick Evaluation

## 2023-09-19 NOTE — Transfer of Care (Signed)
Immediate Anesthesia Transfer of Care Note  Patient: Emily Berry  Procedure(s) Performed: COLONOSCOPY WITH PROPOFOL ESOPHAGOGASTRODUODENOSCOPY (EGD) WITH PROPOFOL ESOPHAGEAL DILATION BIOPSY POLYPECTOMY  Patient Location: PACU  Anesthesia Type:General  Level of Consciousness: sedated  Airway & Oxygen Therapy: Patient Spontanous Breathing  Post-op Assessment: Report given to RN and Post -op Vital signs reviewed and stable  Post vital signs: Reviewed and stable  Last Vitals:  Vitals Value Taken Time  BP 116/64 09/19/23 0823  Temp 36.2 C 09/19/23 0823  Pulse 68 09/19/23 0823  Resp 15 09/19/23 0823  SpO2 99 % 09/19/23 0823    Last Pain:  Vitals:   09/19/23 0823  TempSrc: Temporal  PainSc: Asleep         Complications: No notable events documented.

## 2023-09-19 NOTE — H&P (Signed)
Pre-Procedure H&P   Patient ID: Emily Berry is a 72 y.o. female.  Gastroenterology Provider: [name], DO  Referring Provider: Fransico Setters, NP PCP: Louis Matte, MD  Date: 09/19/2023  HPI Ms. Emily Berry is a 72 y.o. female who presents today for Esophagogastroduodenoscopy and Colonoscopy for Epigastric pain, constipation, dysphagia .  Patient with a personal history of colon polyps.  Also with family history of colon polyps in her sister.  Last colonoscopy in May 2019 only demonstrating sigmoid diverticulosis and internal hemorrhoids.  She deals with chronic constipation but denies melena and hematochezia  She has noted dysphagia to solids and liquids for many years.  She does not have regurgitation or vomiting.  No odynophagia.  Foods seem to "hang" and eventually passed down.  Status post cholecystectomy and hysterectomy  Creatinine 0.8 hemoglobin 13.3 MCV 88.6 platelets 286,000   Past Medical History:  Diagnosis Date   Chest pain    Complication of anesthesia    Depression    Diverticulosis    Dysplastic nevus 07/25/2015   Right lower quadrant abdomen. Mild atypia, limited margins free.    Dyspnea    Fatty liver    Fibromyalgia    GERD (gastroesophageal reflux disease)    Hemorrhoids    Hyperlipidemia    Hypertension    Hypothyroidism    Lung nodule    Migraine headache    OSA on CPAP    PONV (postoperative nausea and vomiting)    Thyroid disease     Past Surgical History:  Procedure Laterality Date   ABDOMINAL HYSTERECTOMY     BACK SURGERY     lumbar   BUNIONECTOMY     COLONOSCOPY WITH PROPOFOL N/A 05/16/2018   Procedure: COLONOSCOPY WITH PROPOFOL;  Surgeon: Pasty Spillers, MD;  Location: [facility];  Service: Endoscopy;  Laterality: N/A;   KNEE ARTHROSCOPY WITH MEDIAL MENISECTOMY Right 05/28/2023   Procedure: KNEE ARTHROSCOPY WITH MEDIAL MENISECTOMY;  Surgeon: Juanell Fairly, MD;  Location: [facility];  Service:  Orthopedics;  Laterality: Right;    Family History Sister- colon polyps No h/o GI disease or malignancy   Review of Systems  Constitutional:  Negative for activity change, appetite change, chills, diaphoresis, fatigue, fever and unexpected weight change.  HENT:  Negative for trouble swallowing and voice change.   Respiratory:  Negative for shortness of breath and wheezing.   Cardiovascular:  Negative for chest pain, palpitations and leg swelling.  Gastrointestinal:  Negative for abdominal distention, abdominal pain, anal bleeding, blood in stool, constipation, diarrhea, nausea, rectal pain and vomiting.  Musculoskeletal:  Negative for arthralgias and myalgias.  Skin:  Negative for color change and pallor.  Neurological:  Negative for dizziness, syncope and weakness.  Psychiatric/Behavioral:  Negative for confusion.   All other systems reviewed and are negative.    Medications No current facility-administered medications on file prior to encounter.   Current Outpatient Medications on File Prior to Encounter  Medication Sig Dispense Refill   aspirin EC 325 MG tablet Take 1 tablet (325 mg total) by mouth daily. 30 tablet 0   Calcium Carb-Cholecalciferol 600-10 MG-MCG TABS Take 1 tablet by mouth 2 (two) times daily.     DULoxetine (CYMBALTA) 60 MG capsule Take 60 mg by mouth every morning.     levothyroxine (SYNTHROID, LEVOTHROID) 50 MCG tablet Take 50 mcg by mouth daily before breakfast.     losartan-hydrochlorothiazide (HYZAAR) 50-12.5 MG tablet Take 1 tablet by mouth every morning.  omeprazole (PRILOSEC) 20 MG capsule Take 20 mg by mouth at bedtime.     oxyCODONE (OXY IR/ROXICODONE) 5 MG immediate release tablet Take 1 tablet (5 mg total) by mouth every 4 (four) hours as needed. 30 tablet 0   pregabalin (LYRICA) 150 MG capsule Take 150 mg by mouth 2 (two) times daily.     simvastatin (ZOCOR) 40 MG tablet Take 40 mg by mouth at bedtime.     baclofen (LIORESAL) 10 MG tablet Take 10  mg by mouth 2 (two) times daily as needed for muscle spasms. (Patient not taking: Reported on 05/28/2023)     ondansetron (ZOFRAN) 4 MG tablet Take 1 tablet (4 mg total) by mouth every 8 (eight) hours as needed for nausea or vomiting. 20 tablet 0    Pertinent medications related to GI and procedure were reviewed by me with the patient prior to the procedure   Current Facility-Administered Medications:    0.9 %  sodium chloride infusion, , Intravenous, Continuous, [name], DO, Last Rate: 20 mL/hr at 09/19/23 0703, New Bag at 09/19/23 0703  sodium chloride 20 mL/hr at 09/19/23 0703       Allergies  Allergen Reactions   Codeine Nausea Only   Allergies were reviewed by me prior to the procedure  Objective   Body mass index is 29.89 kg/m. Vitals:   09/19/23 0656  BP: (!) 156/75  Pulse: 89  Temp: (!) 96.5 F (35.8 C)  TempSrc: Temporal  SpO2: 99%  Weight: 67.1 kg     Physical Exam Vitals and nursing note reviewed.  Constitutional:      General: She is not in acute distress.    Appearance: Normal appearance. She is not ill-appearing, toxic-appearing or diaphoretic.  HENT:     Head: Normocephalic and atraumatic.     Nose: Nose normal.     Mouth/Throat:     Mouth: Mucous membranes are moist.     Pharynx: Oropharynx is clear.  Eyes:     General: No scleral icterus.    Extraocular Movements: Extraocular movements intact.  Cardiovascular:     Rate and Rhythm: Normal rate and regular rhythm.     Heart sounds: Normal heart sounds. No murmur heard.    No friction rub. No gallop.  Pulmonary:     Effort: Pulmonary effort is normal. No respiratory distress.     Breath sounds: Normal breath sounds. No wheezing, rhonchi or rales.  Abdominal:     General: Bowel sounds are normal. There is no distension.     Palpations: Abdomen is soft.     Tenderness: There is no abdominal tenderness. There is no guarding or rebound.  Musculoskeletal:     Cervical back: Neck  supple.     Right lower leg: No edema.     Left lower leg: No edema.  Skin:    General: Skin is warm and dry.     Coloration: Skin is not jaundiced or pale.  Neurological:     General: No focal deficit present.     Mental Status: She is alert and oriented to person, place, and time. Mental status is at baseline.  Psychiatric:        Mood and Affect: Mood normal.        Behavior: Behavior normal.        Thought Content: Thought content normal.        Judgment: Judgment normal.      Assessment:  Ms. Emily Berry is a 72 y.o.  female  who presents today for Esophagogastroduodenoscopy and Colonoscopy for Epigastric pain, constipation, dysphagia .  Plan:  Esophagogastroduodenoscopy and Colonoscopy with possible intervention today  Esophagogastroduodenoscopy and Colonoscopy with possible biopsy, control of bleeding, polypectomy, and interventions as necessary has been discussed with the patient/patient representative. Informed consent was obtained from the patient/patient representative after explaining the indication, nature, and risks of the procedure including but not limited to death, bleeding, perforation, missed neoplasm/lesions, cardiorespiratory compromise, and reaction to medications. Opportunity for questions was given and appropriate answers were provided. Patient/patient representative has verbalized understanding is amenable to undergoing the procedure.   [name], DO  [facility]  Portions of the record may have been created with voice recognition software. Occasional wrong-word or 'sound-a-like' substitutions may have occurred due to the inherent limitations of voice recognition software.  Read the chart carefully and recognize, using context, where substitutions may have occurred.

## 2023-09-19 NOTE — Interval H&P Note (Signed)
History and Physical Interval Note: Preprocedure H&P from 09/19/23  was reviewed and there was no interval change after seeing and examining the patient.  Written consent was obtained from the patient after discussion of risks, benefits, and alternatives. Patient has consented to proceed with Esophagogastroduodenoscopy and Colonoscopy with possible intervention   09/19/2023 7:29 AM  Emily Berry  has presented today for surgery, with the diagnosis of 789.06 (ICD-9-CM) - R10.13 (ICD-10-CM) - Epigastric pain564.00 (ICD-9-CM) - K59.00 (ICD-10-CM) - Constipation, unspecified constipation typeV12.72 (ICD-9-CM) - Z86.010 (ICD-10-CM) - History of colon polyps.  The various methods of treatment have been discussed with the patient and family. After consideration of risks, benefits and other options for treatment, the patient has consented to  Procedure(s): COLONOSCOPY WITH PROPOFOL (N/A) ESOPHAGOGASTRODUODENOSCOPY (EGD) WITH PROPOFOL (N/A) as a surgical intervention.  The patient's history has been reviewed, patient examined, no change in status, stable for surgery.  I have reviewed the patient's chart and labs.  Questions were answered to the patient's satisfaction.     [name]

## 2023-09-19 NOTE — Op Note (Signed)
[facility] Patient Name: Emily Berry Procedure Date: 09/19/2023 7:11 AM MRN: [id number] Account #: [id number] Date of Birth: April 12, 1951 Admit Type: Outpatient Age: 72 Room: [facility] ENDO ROOM 1 Gender: Female Note Status: Finalized Instrument Name: Colonoscope [id number] Procedure:             Colonoscopy Indications:           High risk colon cancer surveillance: Personal history                         of colonic polyps Providers:             [name] DO, DO Referring MD:          No Local Md, MD (Referring MD) Medicines:             Monitored Anesthesia Care Complications:         No immediate complications. Estimated blood loss:                         Minimal. Procedure:             Pre-Anesthesia Assessment:                        - Prior to the procedure, a History and Physical was                         performed, and patient medications and allergies were                         reviewed. The patient is competent. The risks and                         benefits of the procedure and the sedation options and                         risks were discussed with the patient. All questions                         were answered and informed consent was obtained.                         Patient identification and proposed procedure were                         verified by the physician, the nurse, the anesthetist                         and the technician in the endoscopy suite. Mental                         Status Examination: alert and oriented. Airway                         Examination: normal oropharyngeal airway and neck                         mobility. Respiratory Examination: clear to  auscultation. CV Examination: RRR, no murmurs, no S3                         or S4. Prophylactic Antibiotics: The patient does not                         require prophylactic antibiotics. Prior                          Anticoagulants: The patient has taken no anticoagulant                         or antiplatelet agents. ASA Grade Assessment: II - A                         patient with mild systemic disease. After reviewing                         the risks and benefits, the patient was deemed in                         satisfactory condition to undergo the procedure. The                         anesthesia plan was to use monitored anesthesia care                         (MAC). Immediately prior to administration of                         medications, the patient was re-assessed for adequacy                         to receive sedatives. The heart rate, respiratory                         rate, oxygen saturations, blood pressure, adequacy of                         pulmonary ventilation, and response to care were                         monitored throughout the procedure. The physical                         status of the patient was re-assessed after the                         procedure.                        After obtaining informed consent, the colonoscope was                         passed under direct vision. Throughout the procedure,                         the patient's blood pressure, pulse, and oxygen  saturations were monitored continuously. The                         Colonoscope was introduced through the anus and                         advanced to the the cecum, identified by appendiceal                         orifice and ileocecal valve. The colonoscopy was                         performed without difficulty. The patient tolerated                         the procedure well. The quality of the bowel                         preparation was evaluated using the BBPS [facility] Bowel                         Preparation Scale) with scores of: Right Colon = 2                         (minor amount of residual staining, small fragments of                         stool and/or  opaque liquid, but mucosa seen well),                         Transverse Colon = 3 (entire mucosa seen well with no                         residual staining, small fragments of stool or opaque                         liquid) and Left Colon = 2 (minor amount of residual                         staining, small fragments of stool and/or opaque                         liquid, but mucosa seen well). The total BBPS score                         equals 7. The quality of the bowel preparation was                         good. The ileocecal valve, appendiceal orifice, and                         rectum were photographed. Findings:      Hemorrhoids were found on perianal exam.      The digital rectal exam was normal. Pertinent negatives include normal       sphincter tone.      A 12 to 14 mm polyp was found in the transverse colon. The polyp  was       sessile. The polyp was removed with a cold snare. Resection and       retrieval were complete. Area was tattooed with an injection of 1 mL of       [place] ink, placed just distal to polyp. Removed in piecemeal fashion.       Estimated blood loss was minimal.      Multiple small-mouthed diverticula were found in the left colon.       Estimated blood loss: none.      Non-bleeding internal hemorrhoids were found during retroflexion and       during perianal exam. The hemorrhoids were Grade III (internal       hemorrhoids that prolapse but require manual reduction). Estimated blood       loss: none.      The exam was otherwise without abnormality on direct and retroflexion       views. Impression:            - Hemorrhoids found on perianal exam.                        - One 12 to 14 mm polyp in the transverse colon,                         removed with a cold snare. Resected and retrieved.                         Tattooed.                        - Diverticulosis in the left colon.                        - Non-bleeding internal hemorrhoids.                         - The examination was otherwise normal on direct and                         retroflexion views. Recommendation:        - Patient has a contact number available for                         emergencies. The signs and symptoms of potential                         delayed complications were discussed with the patient.                         Return to normal activities tomorrow. Written                         discharge instructions were provided to the patient.                        - Discharge patient to home.                        - Resume previous diet.                        -  Continue present medications.                        - No ibuprofen, naproxen, or other non-steroidal                         anti-inflammatory drugs for 5 days after polyp removal.                        - Await pathology results.                        - Repeat colonoscopy in 6 months for surveillance                         after piecemeal polypectomy.                        - Return to GI office as previously scheduled.                        - The findings and recommendations were discussed with                         the patient. Procedure Code(s):     --- Professional ---                        [phone], Colonoscopy, flexible; with removal of                         tumor(s), polyp(s), or other lesion(s) by snare                         technique                        45381, Colonoscopy, flexible; with directed submucosal                         injection(s), any substance Diagnosis Code(s):     --- Professional ---                        Z86.010, Personal history of colonic polyps                        D12.3, Benign neoplasm of transverse colon (hepatic                         flexure or splenic flexure)                        K64.2, Third degree hemorrhoids                        K57.30, Diverticulosis of large intestine without                         perforation or abscess without  bleeding CPT copyright 2022 American Medical Association. All rights reserved. The codes documented in this report are preliminary and upon coder review may  be revised to meet current compliance requirements.  Attending Participation:      I personally performed the entire procedure. Elfredia Nevins, DO [name] DO, DO 09/19/2023 8:27:11 AM This report has been signed electronically. Number of Addenda: 0 Note Initiated On: 09/19/2023 7:11 AM Scope Withdrawal Time: 0 hours 22 minutes 22 seconds  Total Procedure Duration: 0 hours 25 minutes 40 seconds  Estimated Blood Loss:  Estimated blood loss was minimal.      [facility]

## 2023-09-20 LAB — SURGICAL PATHOLOGY

## 2023-09-23 ENCOUNTER — Encounter: Payer: Self-pay | Admitting: Gastroenterology

## 2023-10-14 ENCOUNTER — Other Ambulatory Visit: Payer: Self-pay | Admitting: Internal Medicine

## 2023-10-14 DIAGNOSIS — Z13820 Encounter for screening for osteoporosis: Secondary | ICD-10-CM

## 2023-10-14 DIAGNOSIS — M8580 Other specified disorders of bone density and structure, unspecified site: Secondary | ICD-10-CM

## 2023-12-03 ENCOUNTER — Other Ambulatory Visit: Payer: Self-pay | Admitting: Internal Medicine

## 2023-12-03 DIAGNOSIS — R519 Headache, unspecified: Secondary | ICD-10-CM

## 2023-12-10 ENCOUNTER — Ambulatory Visit
Admission: RE | Admit: 2023-12-10 | Discharge: 2023-12-10 | Disposition: A | Discharge status: Home / Self Care | Payer: Medicare HMO | Source: Ambulatory Visit | Attending: Internal Medicine | Admitting: Internal Medicine

## 2023-12-10 DIAGNOSIS — R519 Headache, unspecified: Secondary | ICD-10-CM | POA: Insufficient documentation

## 2023-12-10 MED ORDER — GADOBUTROL 1 MMOL/ML IV SOLN
6.0000 mL | Freq: Once | INTRAVENOUS | Status: AC | PRN
  Administered 2023-12-10: 6 mL via INTRAVENOUS

## 2024-01-16 ENCOUNTER — Other Ambulatory Visit: Payer: 59

## 2024-02-11 ENCOUNTER — Encounter: Payer: Self-pay | Admitting: Gastroenterology

## 2024-02-12 ENCOUNTER — Other Ambulatory Visit: Payer: 59

## 2024-02-19 ENCOUNTER — Ambulatory Visit
Admission: RE | Admit: 2024-02-19 | Discharge: 2024-02-19 | Disposition: A | Discharge status: Home / Self Care | Payer: 59 | Source: Ambulatory Visit | Attending: Internal Medicine | Admitting: Internal Medicine

## 2024-02-19 DIAGNOSIS — M8580 Other specified disorders of bone density and structure, unspecified site: Secondary | ICD-10-CM

## 2024-02-19 DIAGNOSIS — M810 Age-related osteoporosis without current pathological fracture: Secondary | ICD-10-CM | POA: Diagnosis not present

## 2024-02-19 DIAGNOSIS — Z13820 Encounter for screening for osteoporosis: Secondary | ICD-10-CM | POA: Insufficient documentation

## 2024-02-21 ENCOUNTER — Encounter: Payer: Self-pay | Admitting: Gastroenterology

## 2024-02-21 ENCOUNTER — Other Ambulatory Visit: Payer: Self-pay | Admitting: Student

## 2024-02-21 DIAGNOSIS — R0602 Shortness of breath: Secondary | ICD-10-CM

## 2024-02-21 DIAGNOSIS — I2089 Other forms of angina pectoris: Secondary | ICD-10-CM

## 2024-02-23 NOTE — H&P (Signed)
 Pre-Procedure H&P   Patient ID: Emily Berry is a 73 y.o. female.  Gastroenterology Provider: [name], DO  Referring Provider: Fransico Setters, NP PCP: Louis Matte, MD  Date: 02/24/2024  HPI Ms. Emily Berry is a 73 y.o. female who presents today for Colonoscopy for personal history of colon polyps .  Last underwent colonoscopy 08/2023 with 12-17mm TA in the transverse colon removed in piecemeal. Area nearby was tattooed. Left sided diverticulosis and IH were also appreciated. BBPS7  No fhx crc or polyps  Reports daily bm. S/p cholecystectomy and hysterectomy  Ferr 25 sat 16 tibc 441 hgb 13.3 mcv 88.6 plt 286K cr 0.8 No fhx crc  04/2018- csy- no polyps; sigmoid diverticulosis   Past Medical History:  Diagnosis Date   ANA positive    Benign neoplasm of ascending colon    Cervical radiculitis    Chest pain    Complication of anesthesia    COPD (chronic obstructive pulmonary disease) (HCC)    Depression    Diverticulosis    Dysplastic nevus 07/25/2015   Right lower quadrant abdomen. Mild atypia, limited margins free.    Dyspnea    Fatty liver    Fibromyalgia    GERD (gastroesophageal reflux disease)    Hemorrhoids    Hyperlipidemia    Hypertension    Hypothyroidism    Lung nodule    Migraine headache    OSA on CPAP    Osteopenia    PONV (postoperative nausea and vomiting)    Primary osteoarthritis involving multiple joints    Radiculopathy of lumbar region    Shoulder tendinitis    Thyroid disease     Past Surgical History:  Procedure Laterality Date   ABDOMINAL HYSTERECTOMY     BACK SURGERY     lumbar   BIOPSY  09/19/2023   Procedure: BIOPSY;  Surgeon: [name], DO;  Location: [facility] ENDOSCOPY;  Service: Gastroenterology;;   Emily Berry     COLONOSCOPY WITH PROPOFOL N/A 05/16/2018   Procedure: COLONOSCOPY WITH PROPOFOL;  Surgeon: Pasty Spillers, MD;  Location: [facility] ENDOSCOPY;  Service: Endoscopy;   Laterality: N/A;   COLONOSCOPY WITH PROPOFOL N/A 09/19/2023   Procedure: COLONOSCOPY WITH PROPOFOL;  Surgeon: [name], DO;  Location: [facility] ENDOSCOPY;  Service: Gastroenterology;  Laterality: N/A;   ESOPHAGEAL DILATION  09/19/2023   Procedure: ESOPHAGEAL DILATION;  Surgeon: [name], DO;  Location: [facility] ENDOSCOPY;  Service: Gastroenterology;;   ESOPHAGOGASTRODUODENOSCOPY (EGD) WITH PROPOFOL N/A 09/19/2023   Procedure: ESOPHAGOGASTRODUODENOSCOPY (EGD) WITH PROPOFOL;  Surgeon: [name], DO;  Location: [facility] ENDOSCOPY;  Service: Gastroenterology;  Laterality: N/A;   EYE SURGERY     KNEE ARTHROSCOPY WITH MEDIAL MENISECTOMY Right 05/28/2023   Procedure: KNEE ARTHROSCOPY WITH MEDIAL MENISECTOMY;  Surgeon: Juanell Fairly, MD;  Location: [facility] ORS;  Service: Orthopedics;  Laterality: Right;   POLYPECTOMY  09/19/2023   Procedure: POLYPECTOMY;  Surgeon: [name], DO;  Location: [facility] ENDOSCOPY;  Service: Gastroenterology;;   SUBMUCOSAL TATTOO INJECTION  09/19/2023   Procedure: SUBMUCOSAL TATTOO INJECTION;  Surgeon: [name], DO;  Location: [facility] ENDOSCOPY;  Service: Gastroenterology;;    Family History No h/o GI disease or malignancy  Review of Systems  Constitutional:  Negative for activity change, appetite change, chills, diaphoresis, fatigue, fever and unexpected weight change.  HENT:  Negative for trouble swallowing and voice change.   Respiratory:  Negative for shortness of breath and wheezing.   Cardiovascular:  Negative for chest pain, palpitations  and leg swelling.  Gastrointestinal:  Negative for abdominal distention, abdominal pain, anal bleeding, blood in stool, constipation, diarrhea, nausea, rectal pain and vomiting.  Musculoskeletal:  Negative for arthralgias and myalgias.  Skin:  Negative for color change and pallor.  Neurological:  Negative for dizziness, syncope and weakness.  Psychiatric/Behavioral:  Negative for  confusion.   All other systems reviewed and are negative.    Medications No current facility-administered medications on file prior to encounter.   Current Outpatient Medications on File Prior to Encounter  Medication Sig Dispense Refill   levothyroxine (SYNTHROID, LEVOTHROID) 50 MCG tablet Take 50 mcg by mouth daily before breakfast.     losartan-hydrochlorothiazide (HYZAAR) 50-12.5 MG tablet Take 1 tablet by mouth every morning.     aspirin EC 325 MG tablet Take 1 tablet (325 mg total) by mouth daily. 30 tablet 0   baclofen (LIORESAL) 10 MG tablet Take 10 mg by mouth 2 (two) times daily as needed for muscle spasms. (Patient not taking: Reported on 05/28/2023)     Calcium Carb-Cholecalciferol 600-10 MG-MCG TABS Take 1 tablet by mouth 2 (two) times daily.     DULoxetine (CYMBALTA) 60 MG capsule Take 60 mg by mouth every morning.     omeprazole (PRILOSEC) 20 MG capsule Take 20 mg by mouth at bedtime.     ondansetron (ZOFRAN) 4 MG tablet Take 1 tablet (4 mg total) by mouth every 8 (eight) hours as needed for nausea or vomiting. 20 tablet 0   oxyCODONE (OXY IR/ROXICODONE) 5 MG immediate release tablet Take 1 tablet (5 mg total) by mouth every 4 (four) hours as needed. 30 tablet 0   pregabalin (LYRICA) 150 MG capsule Take 150 mg by mouth 2 (two) times daily.     simvastatin (ZOCOR) 40 MG tablet Take 40 mg by mouth at bedtime.      Pertinent medications related to GI and procedure were reviewed by me with the patient prior to the procedure   Current Facility-Administered Medications:    0.9 %  sodium chloride infusion, , Intravenous, Continuous, [name], DO, Last Rate: 20 mL/hr at 02/24/24 0829, New Bag at 02/24/24 5409  sodium chloride 20 mL/hr at 02/24/24 8119       Allergies  Allergen Reactions   Codeine Nausea Only   Allergies were reviewed by me prior to the procedure  Objective   Body mass index is 28.32 kg/m. Vitals:   02/24/24 0822  BP: 135/76  Pulse: 75   Resp: 14  Temp: (!) 96.7 F (35.9 C)  TempSrc: Temporal  SpO2: 99%  Weight: 65.8 kg  Height: 5' (1.524 m)     Physical Exam Vitals and nursing note reviewed.  Constitutional:      General: She is not in acute distress.    Appearance: Normal appearance. She is not ill-appearing, toxic-appearing or diaphoretic.  HENT:     Head: Normocephalic and atraumatic.     Nose: Nose normal.     Mouth/Throat:     Mouth: Mucous membranes are moist.     Pharynx: Oropharynx is clear.  Eyes:     General: No scleral icterus.    Extraocular Movements: Extraocular movements intact.  Cardiovascular:     Rate and Rhythm: Normal rate and regular rhythm.     Heart sounds: Normal heart sounds. No murmur heard.    No friction rub. No gallop.  Pulmonary:     Effort: Pulmonary effort is normal. No respiratory distress.     Breath sounds: Normal  breath sounds. No wheezing, rhonchi or rales.  Abdominal:     General: Bowel sounds are normal. There is no distension.     Palpations: Abdomen is soft.     Tenderness: There is no abdominal tenderness. There is no guarding or rebound.  Musculoskeletal:     Cervical back: Neck supple.     Right lower leg: No edema.     Left lower leg: No edema.  Skin:    General: Skin is warm and dry.     Coloration: Skin is not jaundiced or pale.  Neurological:     General: No focal deficit present.     Mental Status: She is alert and oriented to person, place, and time. Mental status is at baseline.  Psychiatric:        Mood and Affect: Mood normal.        Behavior: Behavior normal.        Thought Content: Thought content normal.        Judgment: Judgment normal.      Assessment:  Ms. Emily Berry is a 73 y.o. female  who presents today for Colonoscopy for personal history of colon polyps .  Plan:  Colonoscopy with possible intervention today  Colonoscopy with possible biopsy, control of bleeding, polypectomy, and interventions as necessary has been  discussed with the patient/patient representative. Informed consent was obtained from the patient/patient representative after explaining the indication, nature, and risks of the procedure including but not limited to death, bleeding, perforation, missed neoplasm/lesions, cardiorespiratory compromise, and reaction to medications. Opportunity for questions was given and appropriate answers were provided. Patient/patient representative has verbalized understanding is amenable to undergoing the procedure.   [name], DO  [facility]  Portions of the record may have been created with voice recognition software. Occasional wrong-word or 'sound-a-like' substitutions may have occurred due to the inherent limitations of voice recognition software.  Read the chart carefully and recognize, using context, where substitutions may have occurred.

## 2024-02-24 ENCOUNTER — Encounter
Admission: RE | Disposition: A | Discharge status: Home / Self Care | Payer: Self-pay | Source: Ambulatory Visit | Attending: Gastroenterology

## 2024-02-24 ENCOUNTER — Encounter: Payer: Self-pay | Admitting: Gastroenterology

## 2024-02-24 ENCOUNTER — Ambulatory Visit
Admission: RE | Admit: 2024-02-24 | Discharge: 2024-02-24 | Disposition: A | Discharge status: Home / Self Care | Payer: 59 | Source: Ambulatory Visit | Attending: Gastroenterology | Admitting: Gastroenterology

## 2024-02-24 ENCOUNTER — Ambulatory Visit: Admitting: Anesthesiology

## 2024-02-24 DIAGNOSIS — R519 Headache, unspecified: Secondary | ICD-10-CM | POA: Insufficient documentation

## 2024-02-24 DIAGNOSIS — D122 Benign neoplasm of ascending colon: Secondary | ICD-10-CM | POA: Insufficient documentation

## 2024-02-24 DIAGNOSIS — E039 Hypothyroidism, unspecified: Secondary | ICD-10-CM | POA: Diagnosis not present

## 2024-02-24 DIAGNOSIS — G4733 Obstructive sleep apnea (adult) (pediatric): Secondary | ICD-10-CM | POA: Diagnosis not present

## 2024-02-24 DIAGNOSIS — M797 Fibromyalgia: Secondary | ICD-10-CM | POA: Insufficient documentation

## 2024-02-24 DIAGNOSIS — K5730 Diverticulosis of large intestine without perforation or abscess without bleeding: Secondary | ICD-10-CM | POA: Diagnosis not present

## 2024-02-24 DIAGNOSIS — Z7982 Long term (current) use of aspirin: Secondary | ICD-10-CM | POA: Diagnosis not present

## 2024-02-24 DIAGNOSIS — D123 Benign neoplasm of transverse colon: Secondary | ICD-10-CM | POA: Insufficient documentation

## 2024-02-24 DIAGNOSIS — J449 Chronic obstructive pulmonary disease, unspecified: Secondary | ICD-10-CM | POA: Diagnosis not present

## 2024-02-24 DIAGNOSIS — Z79899 Other long term (current) drug therapy: Secondary | ICD-10-CM | POA: Insufficient documentation

## 2024-02-24 DIAGNOSIS — K640 First degree hemorrhoids: Secondary | ICD-10-CM | POA: Insufficient documentation

## 2024-02-24 DIAGNOSIS — I10 Essential (primary) hypertension: Secondary | ICD-10-CM | POA: Diagnosis not present

## 2024-02-24 DIAGNOSIS — K219 Gastro-esophageal reflux disease without esophagitis: Secondary | ICD-10-CM | POA: Diagnosis not present

## 2024-02-24 DIAGNOSIS — Z1211 Encounter for screening for malignant neoplasm of colon: Secondary | ICD-10-CM | POA: Diagnosis present

## 2024-02-24 DIAGNOSIS — F32A Depression, unspecified: Secondary | ICD-10-CM | POA: Diagnosis not present

## 2024-02-24 DIAGNOSIS — Z79890 Hormone replacement therapy (postmenopausal): Secondary | ICD-10-CM | POA: Insufficient documentation

## 2024-02-24 HISTORY — DX: Other specified disorders of bone density and structure, unspecified site: M85.80

## 2024-02-24 HISTORY — DX: Primary generalized (osteo)arthritis: M15.0

## 2024-02-24 HISTORY — DX: Radiculopathy, lumbar region: M54.16

## 2024-02-24 HISTORY — DX: Other specified abnormal immunological findings in serum: R76.8

## 2024-02-24 HISTORY — DX: Other enthesopathies, not elsewhere classified: M77.8

## 2024-02-24 HISTORY — DX: Chronic obstructive pulmonary disease, unspecified: J44.9

## 2024-02-24 HISTORY — DX: Radiculopathy, cervical region: M54.12

## 2024-02-24 HISTORY — DX: Benign neoplasm of ascending colon: D12.2

## 2024-02-24 HISTORY — PX: POLYPECTOMY: SHX5525

## 2024-02-24 HISTORY — PX: COLONOSCOPY: SHX5424

## 2024-02-24 SURGERY — COLONOSCOPY
Anesthesia: General

## 2024-02-24 MED ORDER — PROPOFOL 500 MG/50ML IV EMUL
INTRAVENOUS | Status: DC | PRN
  Administered 2024-02-24: 150 ug/kg/min via INTRAVENOUS

## 2024-02-24 MED ORDER — PROPOFOL 10 MG/ML IV BOLUS
INTRAVENOUS | Status: DC | PRN
  Administered 2024-02-24: 100 mg via INTRAVENOUS

## 2024-02-24 MED ORDER — PROPOFOL 1000 MG/100ML IV EMUL
INTRAVENOUS | Status: AC
  Filled 2024-02-24: qty 100

## 2024-02-24 MED ORDER — SODIUM CHLORIDE 0.9 % IV SOLN: INTRAVENOUS | Status: DC

## 2024-02-24 NOTE — Anesthesia Postprocedure Evaluation (Signed)
 Anesthesia Post Note  Patient: Emily Berry  Procedure(s) Performed: COLONOSCOPY POLYPECTOMY  Patient location during evaluation: Endoscopy Anesthesia Type: General Level of consciousness: awake and alert Pain management: pain level controlled Vital Signs Assessment: post-procedure vital signs reviewed and stable Respiratory status: spontaneous breathing, nonlabored ventilation, respiratory function stable and patient connected to nasal cannula oxygen Cardiovascular status: blood pressure returned to baseline and stable Postop Assessment: no apparent nausea or vomiting Anesthetic complications: no   There were no known notable events for this encounter.   Last Vitals:  Vitals:   02/24/24 0923 02/24/24 0933  BP: (!) 102/57   Pulse: 78   Resp: 16 18  Temp: (!) 36.1 C   SpO2: 95%     Last Pain:  Vitals:   02/24/24 0943  TempSrc:   PainSc: 0-No pain                 Corinda Gubler

## 2024-02-24 NOTE — Interval H&P Note (Signed)
 History and Physical Interval Note: Preprocedure H&P from 02/24/24  was reviewed and there was no interval change after seeing and examining the patient.  Written consent was obtained from the patient after discussion of risks, benefits, and alternatives. Patient has consented to proceed with Colonoscopy with possible intervention   02/24/2024 8:38 AM  [name]  has presented today for surgery, with the diagnosis of FH: colon polyps (Z83.719).  The various methods of treatment have been discussed with the patient and family. After consideration of risks, benefits and other options for treatment, the patient has consented to  Procedure(s): COLONOSCOPY (N/A) as a surgical intervention.  The patient's history has been reviewed, patient examined, no change in status, stable for surgery.  I have reviewed the patient's chart and labs.  Questions were answered to the patient's satisfaction.     Emily Berry

## 2024-02-24 NOTE — Op Note (Signed)
 [facility]astroenterology Patient Name: Emily Berry Procedure Date: 02/24/2024 8:37 AM MRN: [id number] Account #: [id number] Date of Birth: 1951/10/20 Admit Type: Outpatient Age: 73 Room: [facility] ENDO ROOM 2 Gender: Female Note Status: Finalized Instrument Name: Colonoscope [id number] Procedure:             Colonoscopy Indications:           High risk colon cancer surveillance: Personal history                         of colonic polyps Providers:             [name] DO, DO Referring MD:          No Local Md, MD (Referring MD) Medicines:             Monitored Anesthesia Care Complications:         No immediate complications. Estimated blood loss:                         Minimal. Procedure:             Pre-Anesthesia Assessment:                        - Prior to the procedure, a History and Physical was                         performed, and patient medications and allergies were                         reviewed. The patient is competent. The risks and                         benefits of the procedure and the sedation options and                         risks were discussed with the patient. All questions                         were answered and informed consent was obtained.                         Patient identification and proposed procedure were                         verified by the physician, the nurse, the anesthetist                         and the technician in the endoscopy suite. Mental                         Status Examination: alert and oriented. Airway                         Examination: normal oropharyngeal airway and neck                         mobility. Respiratory Examination: clear to  auscultation. CV Examination: RRR, no murmurs, no S3                         or S4. Prophylactic Antibiotics: The patient does not                         require prophylactic antibiotics. Prior                          Anticoagulants: The patient has taken no anticoagulant                         or antiplatelet agents. ASA Grade Assessment: II - A                         patient with mild systemic disease. After reviewing                         the risks and benefits, the patient was deemed in                         satisfactory condition to undergo the procedure. The                         anesthesia plan was to use monitored anesthesia care                         (MAC). Immediately prior to administration of                         medications, the patient was re-assessed for adequacy                         to receive sedatives. The heart rate, respiratory                         rate, oxygen saturations, blood pressure, adequacy of                         pulmonary ventilation, and response to care were                         monitored throughout the procedure. The physical                         status of the patient was re-assessed after the                         procedure.                        After obtaining informed consent, the colonoscope was                         passed under direct vision. Throughout the procedure,                         the patient's blood pressure, pulse, and oxygen  saturations were monitored continuously. The                         Colonoscope was introduced through the anus and                         advanced to the the cecum, identified by appendiceal                         orifice and ileocecal valve. The colonoscopy was                         performed without difficulty. The patient tolerated                         the procedure well. The quality of the bowel                         preparation was evaluated using the BBPS [facility] Bowel                         Preparation Scale) with scores of: Right Colon = 3                         (entire mucosa seen well with no residual staining,                         small fragments  of stool or opaque liquid), Transverse                         Colon = 3 (entire mucosa seen well with no residual                         staining, small fragments of stool or opaque liquid)                         and Left Colon = 2 (minor amount of residual staining,                         small fragments of stool and/or opaque liquid, but                         mucosa seen well). The total BBPS score equals 8. The                         quality of the bowel preparation was excellent. The                         ileocecal valve, appendiceal orifice, and rectum were                         photographed. Findings:      The perianal and digital rectal examinations were normal. Pertinent       negatives include normal sphincter tone; some skin tags were noted.      Two sessile polyps were found in the ascending colon. The polyps were 1  to 2 mm in size. These polyps were removed with a jumbo cold forceps.       Resection and retrieval were complete. Estimated blood loss was minimal.      Multiple small-mouthed diverticula were found in the sigmoid colon.      Non-bleeding internal hemorrhoids were found during retroflexion. The       hemorrhoids were Grade I (internal hemorrhoids that do not prolapse).       Estimated blood loss: none.      A tattoo was seen in the transverse colon. The tattoo site appeared       abnormal. Residual polyp; see below      A 9 to 10 mm polyp was found in the transverse colon. The polyp was       sessile. Polypectomy was attempted, initially using a cold snare. Polyp       resection was incomplete with this device. This intervention then       required a different device and polypectomy technique. The polyp was       removed with a jumbo cold forceps. Resection and retrieval were       complete. Estimated blood loss was minimal. This was just proximal to       the tattoo where previous polypectomy was performed. This was likely       residual polyp.  Polyp stretched over the fold.      The exam was otherwise without abnormality on direct and retroflexion       views. Impression:            - Two 1 to 2 mm polyps in the ascending colon, removed                         with a jumbo cold forceps. Resected and retrieved.                        - Diverticulosis in the sigmoid colon.                        - Non-bleeding internal hemorrhoids.                        - A tattoo was seen in the transverse colon. The                         tattoo site appeared abnormal.                        - One 9 to 10 mm polyp in the transverse colon,                         removed with a jumbo cold forceps. Resected and                         retrieved.                        - The examination was otherwise normal on direct and                         retroflexion views. Recommendation:        - Patient has a contact number available  for                         emergencies. The signs and symptoms of potential                         delayed complications were discussed with the patient.                         Return to normal activities tomorrow. Written                         discharge instructions were provided to the patient.                        - Discharge patient to home.                        - Resume previous diet.                        - Continue present medications.                        - Await pathology results.                        - Repeat colonoscopy for surveillance based on                         pathology results.                        - Return to referring physician as previously                         scheduled.                        - The findings and recommendations were discussed with                         the patient.                        - The findings and recommendations were discussed with                         the patient's family. Procedure Code(s):     --- Professional ---                         [id number], Colonoscopy, flexible; with biopsy, single or                         multiple Diagnosis Code(s):     --- Professional ---                        Z86.010, Personal history of colonic polyps                        D12.2, Benign neoplasm of ascending colon  K64.0, First degree hemorrhoids                        D12.3, Benign neoplasm of transverse colon (hepatic                         flexure or splenic flexure)                        K57.30, Diverticulosis of large intestine without                         perforation or abscess without bleeding CPT copyright 2022 American Medical Association. All rights reserved. The codes documented in this report are preliminary and upon coder review may  be revised to meet current compliance requirements. Attending Participation:      I personally performed the entire procedure. Elfredia Nevins, DO [name] DO, DO 02/24/2024 9:27:53 AM This report has been signed electronically. Number of Addenda: 0 Note Initiated On: 02/24/2024 8:37 AM Scope Withdrawal Time: 0 hours 21 minutes 58 seconds  Total Procedure Duration: 0 hours 27 minutes 1 second  Estimated Blood Loss:  Estimated blood loss was minimal.      [facility]

## 2024-02-24 NOTE — Transfer of Care (Signed)
 Immediate Anesthesia Transfer of Care Note  Patient: Emily Berry  Procedure(s) Performed: COLONOSCOPY POLYPECTOMY  Patient Location: PACU  Anesthesia Type:General  Level of Consciousness: awake and sedated  Airway & Oxygen Therapy: Patient Spontanous Breathing and Patient connected to nasal cannula oxygen  Post-op Assessment: Report given to RN and Post -op Vital signs reviewed and stable  Post vital signs: Reviewed and stable  Last Vitals:  Vitals Value Taken Time  BP    Temp    Pulse    Resp    SpO2      Last Pain:  Vitals:   02/24/24 0822  TempSrc: Temporal  PainSc: 0-No pain         Complications: There were no known notable events for this encounter.

## 2024-02-24 NOTE — Anesthesia Preprocedure Evaluation (Signed)
 Anesthesia Evaluation  Patient identified by MRN, date of birth, ID band Patient awake    Reviewed: Allergy & Precautions, NPO status , Patient's Chart, lab work & pertinent test results  History of Anesthesia Complications (+) PONV and history of anesthetic complications  Airway Mallampati: II  TM Distance: >3 FB Neck ROM: Full    Dental no notable dental hx. (+) Teeth Intact   Pulmonary sleep apnea and Continuous Positive Airway Pressure Ventilation , COPD, Patient abstained from smoking.Not current smoker   Pulmonary exam normal breath sounds clear to auscultation       Cardiovascular Exercise Tolerance: Good METShypertension, (-) CAD and (-) Past MI (-) dysrhythmias  Rhythm:Regular Rate:Normal - Systolic murmurs    Neuro/Psych  Headaches PSYCHIATRIC DISORDERS  Depression       GI/Hepatic ,GERD  ,,(+)     (-) substance abuse    Endo/Other  neg diabetesHypothyroidism    Renal/GU negative Renal ROS     Musculoskeletal  (+)  Fibromyalgia -  Abdominal   Peds  Hematology   Anesthesia Other Findings Past Medical History: No date: ANA positive No date: Benign neoplasm of ascending colon No date: Cervical radiculitis No date: Chest pain No date: Complication of anesthesia No date: COPD (chronic obstructive pulmonary disease) (HCC) No date: Depression No date: Diverticulosis 07/25/2015: Dysplastic nevus     Comment:  Right lower quadrant abdomen. Mild atypia, limited               margins free.  No date: Dyspnea No date: Fatty liver No date: Fibromyalgia No date: GERD (gastroesophageal reflux disease) No date: Hemorrhoids No date: Hyperlipidemia No date: Hypertension No date: Hypothyroidism No date: Lung nodule No date: Migraine headache No date: OSA on CPAP No date: Osteopenia No date: PONV (postoperative nausea and vomiting) No date: Primary osteoarthritis involving multiple joints No date:  Radiculopathy of lumbar region No date: Shoulder tendinitis No date: Thyroid disease  Reproductive/Obstetrics                              Anesthesia Physical Anesthesia Plan  ASA: 2  Anesthesia Plan: General   Post-op Pain Management: Minimal or no pain anticipated   Induction: Intravenous  PONV Risk Score and Plan: 4 or greater and Propofol infusion, TIVA and Ondansetron  Airway Management Planned: Nasal Cannula  Additional Equipment: None  Intra-op Plan:   Post-operative Plan:   Informed Consent: I have reviewed the patients History and Physical, chart, labs and discussed the procedure including the risks, benefits and alternatives for the proposed anesthesia with the patient or authorized representative who has indicated his/her understanding and acceptance.     Dental advisory given  Plan Discussed with: CRNA and Surgeon  Anesthesia Plan Comments: (Discussed risks of anesthesia with patient, including possibility of difficulty with spontaneous ventilation under anesthesia necessitating airway intervention, PONV, and rare risks such as cardiac or respiratory or neurological events, and allergic reactions. Discussed the role of CRNA in patient's perioperative care. Patient understands.)         Anesthesia Quick Evaluation

## 2024-02-25 ENCOUNTER — Encounter: Payer: Self-pay | Admitting: Gastroenterology

## 2024-02-25 LAB — SURGICAL PATHOLOGY

## 2024-02-28 ENCOUNTER — Other Ambulatory Visit: Payer: Self-pay | Admitting: Internal Medicine

## 2024-02-28 DIAGNOSIS — Z1231 Encounter for screening mammogram for malignant neoplasm of breast: Secondary | ICD-10-CM

## 2024-03-02 ENCOUNTER — Ambulatory Visit
Admission: RE | Admit: 2024-03-02 | Discharge: 2024-03-02 | Disposition: A | Discharge status: Home / Self Care | Source: Ambulatory Visit | Attending: Internal Medicine

## 2024-03-02 ENCOUNTER — Other Ambulatory Visit: Payer: Self-pay | Admitting: Internal Medicine

## 2024-03-02 ENCOUNTER — Ambulatory Visit
Admission: RE | Admit: 2024-03-02 | Discharge: 2024-03-02 | Disposition: A | Discharge status: Home / Self Care | Source: Ambulatory Visit | Attending: Internal Medicine | Admitting: Internal Medicine

## 2024-03-02 DIAGNOSIS — M545 Low back pain, unspecified: Secondary | ICD-10-CM

## 2024-03-05 ENCOUNTER — Telehealth (HOSPITAL_COMMUNITY): Payer: Self-pay | Admitting: Emergency Medicine

## 2024-03-05 DIAGNOSIS — R0789 Other chest pain: Secondary | ICD-10-CM

## 2024-03-05 MED ORDER — IVABRADINE HCL 5 MG PO TABS
5.0000 mg | ORAL_TABLET | Freq: Once | ORAL | 0 refills | Status: AC
Start: 2024-03-05 — End: 2024-03-05

## 2024-03-05 NOTE — Telephone Encounter (Signed)
 Reaching out to patient to offer assistance regarding upcoming cardiac imaging study; pt verbalizes understanding of appt date/time, parking situation and where to check in, pre-test NPO status and medications ordered, and verified current allergies; name and call back number provided for further questions should they arise Rockwell Alexandria RN Navigator Cardiac Imaging [facility] [phone] office [phone] cell   5mg  ivabradine sent to pharmacy

## 2024-03-06 ENCOUNTER — Ambulatory Visit
Admission: RE | Admit: 2024-03-06 | Discharge: 2024-03-06 | Disposition: A | Discharge status: Home / Self Care | Source: Ambulatory Visit | Attending: Internal Medicine | Admitting: Internal Medicine

## 2024-03-06 DIAGNOSIS — Z1231 Encounter for screening mammogram for malignant neoplasm of breast: Secondary | ICD-10-CM | POA: Diagnosis present

## 2024-03-09 ENCOUNTER — Ambulatory Visit
Admission: RE | Admit: 2024-03-09 | Discharge: 2024-03-09 | Disposition: A | Discharge status: Home / Self Care | Source: Ambulatory Visit | Attending: Student | Admitting: Student

## 2024-03-09 DIAGNOSIS — I2089 Other forms of angina pectoris: Secondary | ICD-10-CM | POA: Insufficient documentation

## 2024-03-09 DIAGNOSIS — R0602 Shortness of breath: Secondary | ICD-10-CM | POA: Insufficient documentation

## 2024-03-09 MED ORDER — NITROGLYCERIN 0.4 MG SL SUBL
SUBLINGUAL_TABLET | SUBLINGUAL | Status: AC
  Filled 2024-03-09: qty 2

## 2024-03-09 MED ORDER — NITROGLYCERIN 0.4 MG SL SUBL
0.8000 mg | SUBLINGUAL_TABLET | Freq: Once | SUBLINGUAL | Status: AC
  Administered 2024-03-09: 0.8 mg via SUBLINGUAL
  Filled 2024-03-09: qty 25

## 2024-03-09 MED ORDER — DILTIAZEM HCL 25 MG/5ML IV SOLN: 10.0000 mg | INTRAVENOUS | Status: DC | PRN

## 2024-03-09 MED ORDER — IOHEXOL 350 MG/ML SOLN
80.0000 mL | Freq: Once | INTRAVENOUS | Status: AC | PRN
  Administered 2024-03-09: 80 mL via INTRAVENOUS

## 2024-03-09 MED ORDER — METOPROLOL TARTRATE 5 MG/5ML IV SOLN
INTRAVENOUS | Status: AC
  Filled 2024-03-09: qty 10

## 2024-03-09 MED ORDER — METOPROLOL TARTRATE 5 MG/5ML IV SOLN
10.0000 mg | INTRAVENOUS | Status: DC | PRN
  Administered 2024-03-09: 10 mg via INTRAVENOUS
  Filled 2024-03-09: qty 10

## 2024-10-23 ENCOUNTER — Ambulatory Visit: Attending: Otolaryngology

## 2024-10-23 DIAGNOSIS — G4733 Obstructive sleep apnea (adult) (pediatric): Secondary | ICD-10-CM | POA: Insufficient documentation

## 2024-11-14 ENCOUNTER — Emergency Department
Admission: EM | Admit: 2024-11-14 | Discharge: 2024-11-14 | Disposition: A | Discharge status: Home / Self Care | Attending: Emergency Medicine | Admitting: Emergency Medicine

## 2024-11-14 ENCOUNTER — Other Ambulatory Visit: Payer: Self-pay

## 2024-11-14 ENCOUNTER — Emergency Department

## 2024-11-14 ENCOUNTER — Encounter: Payer: Self-pay | Admitting: Emergency Medicine

## 2024-11-14 DIAGNOSIS — J449 Chronic obstructive pulmonary disease, unspecified: Secondary | ICD-10-CM | POA: Diagnosis not present

## 2024-11-14 DIAGNOSIS — M79605 Pain in left leg: Secondary | ICD-10-CM | POA: Insufficient documentation

## 2024-11-14 DIAGNOSIS — I10 Essential (primary) hypertension: Secondary | ICD-10-CM | POA: Insufficient documentation

## 2024-11-14 DIAGNOSIS — M79604 Pain in right leg: Secondary | ICD-10-CM | POA: Diagnosis present

## 2024-11-14 NOTE — ED Provider Notes (Signed)
-----------------------------------------   3:03 PM on 11/14/2024 -----------------------------------------  Blood pressure 123/76, pulse (!) 113, temperature 98 F (36.7 C), temperature source Oral, resp. rate 18, height 5' (1.524 m), weight 65.8 kg, SpO2 97%.  Assuming care from [name], NP.  In short, Emily Berry is a 73 y.o. female with a chief complaint of Leg pain.  Refer to the original H&P for additional details.  The current plan of care is to await bilateral u/s of lower extremity.  ----------------------------------------- 4:08 PM on 11/14/2024 -----------------------------------------  Bilateral lower extremity ultrasound unremarkable.  Pulse rate has improved to 93 bpm.  Patient is stable for discharge.  Advised follow-up with primary care provider.  We discussed pain management with NSAIDs and Tylenol  as well as considering compression socks.  Patient is agreement with this care plan.  ED return precaution discussed.      Medications - No data to display   ED Discharge Orders     None      Final diagnoses:  None      Margrette Rebbeca LABOR, PA-C 11/14/24 1609    Dorothyann Drivers, MD 11/14/24 [phone]

## 2024-11-14 NOTE — Discharge Instructions (Signed)
 Your ultrasound of both lower legs are normal and does not reveal a DVT.  Please follow-up with your primary care provider.  Consider compression socks in the interim.  Pain control:  Ibuprofen  (motrin /aleve/advil ) - You can take 3 tablets (600 mg) every 6 hours as needed for pain.  Acetaminophen  (tylenol ) - You can take 2 extra strength tablets (1000 mg) every 6 hours as needed for pain.  You can alternate these medications or take them together.  Make sure you eat food/drink water when taking these medications.

## 2024-11-14 NOTE — ED Triage Notes (Signed)
 Patient arrives ambulatory by POV c/o bilateral leg pain ongoing for a while. Patient states she has an appt in feb but pain worsened yesterday. Denies any injury.

## 2024-11-14 NOTE — ED Provider Notes (Signed)
   [facility] Provider Note    Event Date/Time   First MD Initiated Contact with Patient 11/14/24 1407     (approximate)   History   Leg Pain   HPI  Emily Berry is a 73 y.o. female with history of hypertension, hyperlipidemia, thyroid  disease COPD and as listed in EMR presents to the emergency department for treatment and evaluation of bilateral leg pain that has been ongoing for a few weeks but seems to be getting worse.  She called primary care to request an appointment but is unable to be evaluated until February.  Her primary care advised her to come to the emergency department to rule out blood clots.  She has no personal history of DVT or PE.SABRA     Physical Exam    Vitals:   11/14/24 1404 11/14/24 1421  BP:  123/76  Pulse:  (!) 113  Resp:  18  Temp:    SpO2: 99% 97%    General: Awake, no distress.  CV:  Good peripheral perfusion.  Resp:  Normal effort.  Abd:  No distention.  Other:  No edema of the lower extremities noted.  No open wounds or lesions.   ED Results / Procedures / Treatments   Labs (all labs ordered are listed, but only abnormal results are displayed)  Labs Reviewed - No data to display   EKG  Not indicated  RADIOLOGY  Image and radiology report reviewed and interpreted by me. Radiology report consistent with the same.  pending  PROCEDURES:  Critical Care performed: No  Procedures   MEDICATIONS ORDERED IN ED:  Medications - No data to display   IMPRESSION / MDM / ASSESSMENT AND PLAN / ED COURSE   I have reviewed the triage note and vital signs. Vital signs--hypertensive and tachycardic at triage   Differential diagnosis includes, but is not limited to, musculoskeletal pain, DVT, neuropathy  Patient's presentation is most consistent with acute presentation with potential threat to life or bodily function.  73 year old female presenting to the emergency department for treatment and evaluation  of bilateral lower extremity pain that has been ongoing for the past few weeks but seems to be getting worse.  No relief with over-the-counter medications or muscle rub creams.  She denies history of DVT or PE and is not currently on any blood thinners.  She is not diabetic and has no history of neuropathy.  She denies having chest pain, shortness of breath, or pain with breathing.  Bilateral leg pain is described as sharp shooting pain occasionally but dull ache continuously.  She has not noticed any swelling and denies injury.  Plan will be to get bilateral lower extremity ultrasounds to rule out DVT.  Patient aware and agreeable to the plan.  Patient care transferred to [facility], PA-C who will follow-up on results and determine disposition.      FINAL CLINICAL IMPRESSION(S) / ED DIAGNOSES   Final diagnoses:  Bilateral leg pain     Rx / DC Orders   ED Discharge Orders     None        Note:  This document was prepared using Dragon voice recognition software and may include unintentional dictation errors.   Herlinda Kirk NOVAK, FNP 11/14/24 1505    Bradler, Evan K, MD 11/14/24 1539
